# Patient Record
Sex: Female | Born: 2000 | Race: Black or African American | Hispanic: No | Marital: Single | Smoking: Light tobacco smoker
Health system: Southern US, Community
[De-identification: ages and names within clinical notes are randomized; demographics above are authoritative.]

## PROBLEM LIST (undated history)

## (undated) ENCOUNTER — Inpatient Hospital Stay (HOSPITAL_COMMUNITY): Payer: Self-pay

## (undated) DIAGNOSIS — Z789 Other specified health status: Secondary | ICD-10-CM

## (undated) DIAGNOSIS — F32A Depression, unspecified: Secondary | ICD-10-CM

## (undated) HISTORY — DX: Depression, unspecified: F32.A

## (undated) HISTORY — PX: NO PAST SURGERIES: SHX2092

---
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
## 2018-03-10 ENCOUNTER — Emergency Department (HOSPITAL_COMMUNITY)
Admission: EM | Admit: 2018-03-10 | Discharge: 2018-03-10 | Disposition: A | Discharge status: Home / Self Care | Payer: Medicaid Other | Attending: Emergency Medicine | Admitting: Emergency Medicine

## 2018-03-10 ENCOUNTER — Encounter (HOSPITAL_COMMUNITY): Payer: Self-pay

## 2018-03-10 ENCOUNTER — Other Ambulatory Visit: Payer: Self-pay

## 2018-03-10 DIAGNOSIS — N898 Other specified noninflammatory disorders of vagina: Secondary | ICD-10-CM | POA: Insufficient documentation

## 2018-03-10 DIAGNOSIS — K625 Hemorrhage of anus and rectum: Secondary | ICD-10-CM

## 2018-03-10 DIAGNOSIS — Z79899 Other long term (current) drug therapy: Secondary | ICD-10-CM | POA: Insufficient documentation

## 2018-03-10 LAB — I-STAT BETA HCG BLOOD, ED (MC, WL, AP ONLY): I-stat hCG, quantitative: <5 m[IU]/mL (ref <5)

## 2018-03-10 LAB — COMPREHENSIVE METABOLIC PANEL
ALK PHOS: 88 U/L (ref 38–126)
ALT: 12 U/L — AB (ref 14–54)
ANION GAP: 7 (ref 5–15)
AST: 16 U/L (ref 15–41)
Albumin: 4.1 g/dL (ref 3.5–5.0)
BUN: 7 mg/dL (ref 6–20)
CO2: 27 mmol/L (ref 22–32)
Calcium: 9.6 mg/dL (ref 8.9–10.3)
Chloride: 105 mmol/L (ref 101–111)
Creatinine, Ser: 0.78 mg/dL (ref 0.44–1.00)
Glucose, Bld: 96 mg/dL (ref 65–99)
Potassium: 3.7 mmol/L (ref 3.5–5.1)
SODIUM: 139 mmol/L (ref 135–145)
TOTAL PROTEIN: 7.5 g/dL (ref 6.5–8.1)
Total Bilirubin: 0.4 mg/dL (ref 0.3–1.2)

## 2018-03-10 LAB — CBC
HCT: 43.1 % (ref 36.0–46.0)
HEMOGLOBIN: 14.3 g/dL (ref 12.0–15.0)
MCH: 30.8 pg (ref 26.0–34.0)
MCHC: 33.2 g/dL (ref 30.0–36.0)
MCV: 92.7 fL (ref 78.0–100.0)
Platelets: 300 10*3/uL (ref 150–400)
RBC: 4.65 MIL/uL (ref 3.87–5.11)
RDW: 12.2 % (ref 11.5–15.5)
WBC: 5 10*3/uL (ref 4.0–10.5)

## 2018-03-10 LAB — WET PREP, GENITAL
Clue Cells Wet Prep HPF POC: NONE SEEN
Sperm: NONE SEEN
Trich, Wet Prep: NONE SEEN
Yeast Wet Prep HPF POC: NONE SEEN

## 2018-03-10 LAB — URINALYSIS, ROUTINE W REFLEX MICROSCOPIC
Bilirubin Urine: NEGATIVE
GLUCOSE, UA: NEGATIVE mg/dL
Hgb urine dipstick: NEGATIVE
KETONES UR: NEGATIVE mg/dL
LEUKOCYTES UA: NEGATIVE
NITRITE: NEGATIVE
PH: 7 (ref 5.0–8.0)
Protein, ur: NEGATIVE mg/dL
SPECIFIC GRAVITY, URINE: 1.025 (ref 1.005–1.030)

## 2018-03-10 MED ORDER — PSYLLIUM 0.52 G PO CAPS: 0.5200 g | ORAL_CAPSULE | Freq: Every day | ORAL | 0 refills | Status: DC

## 2018-03-10 MED ORDER — AZITHROMYCIN 250 MG PO TABS
1000.0000 mg | ORAL_TABLET | Freq: Once | ORAL | Status: AC
  Administered 2018-03-10: 1000 mg via ORAL
  Filled 2018-03-10: qty 4

## 2018-03-10 MED ORDER — LIDOCAINE HCL (PF) 1 % IJ SOLN
INTRAMUSCULAR | Status: AC
  Administered 2018-03-10: 5 mL
  Filled 2018-03-10: qty 5

## 2018-03-10 MED ORDER — CEFTRIAXONE SODIUM 250 MG IJ SOLR
250.0000 mg | Freq: Once | INTRAMUSCULAR | Status: AC
  Administered 2018-03-10: 250 mg via INTRAMUSCULAR
  Filled 2018-03-10: qty 250

## 2018-03-10 NOTE — ED Triage Notes (Signed)
Pt has multiple complaints. She states she has been having some bleeding from her rectum, light red in color. She states also had a cough with some blood tinged sputum. Pt also reports yellow vaginal discharge with itching.

## 2018-03-10 NOTE — ED Notes (Signed)
Pt states that she has noticed some blood after coughing for the past month and blood on toilet paper after using restroom. Pt is vague on description  of amount and other details.

## 2018-03-10 NOTE — ED Provider Notes (Signed)
[facility] EMERGENCY DEPARTMENT Provider Note   CSN: [id number] Arrival date & time: 03/10/18  1006     History   Chief Complaint Chief Complaint  Patient presents with  . Rectal Bleeding    HPI Stacy Sellers is a 17 y.o. female.  HPI   17 year old female presents today with several complaints.  Patient notes last night she had a small amount of blood in her saliva, notes this is one episode, denies any associated abnormal bruising or bleeding anywhere (other than rectal).  She notes no trauma to the mouth, no upper respiratory symptoms, and no bleeding since, history of the same.  Patient notes that 3 days ago after wiping she noted small amount of red blood on the toilet paper.  She notes this is similar to previous episodes that were associated with constipation.  She notes she was constipated yesterday.  She denies any bleeding today, denies any masses, denies any abdominal pain fever nausea or vomiting.  Patient reports that she is sexually active, she notes 3 days of yellow vaginal discharge, she reports she has sex with men.    Past Medical History:  Diagnosis Date  . Medical history non-contributory     Patient Active Problem List   Diagnosis Date Noted  . NVD (normal vaginal delivery) 05/26/2015  . Full-term premature rupture of membranes 05/25/2015    Past Surgical History:  Procedure Laterality Date  . NO PAST SURGERIES       OB History    Gravida  1   Para  1   Term      Preterm  1   AB      Living  1     SAB      TAB      Ectopic      Multiple  0   Live Births  1            Home Medications    Prior to Admission medications   Medication Sig Start Date End Date Taking? Authorizing Provider  clindamycin (CLEOCIN) 150 MG capsule Take 3 capsules (450 mg total) by mouth 3 (three) times daily. 07/03/17   Muthersbaugh, Dahlia Client, PA-C  HYDROcodone-acetaminophen (NORCO/VICODIN) 5-325 MG tablet Take 1-2 tablets by mouth  every 6 (six) hours as needed. 01/30/16   Hess, Nada Boozer, PA-C  ibuprofen (ADVIL,MOTRIN) 800 MG tablet Take 1 tablet (800 mg total) by mouth 3 (three) times daily. 07/03/17   Muthersbaugh, Dahlia Client, PA-C  phenylephrine-shark liver oil-mineral oil-petrolatum (PREPARATION H) 0.25-3-14-71.9 % rectal ointment Place 1 application rectally 2 (two) times daily as needed for hemorrhoids. 11/01/15   Hess, Nada Boozer, PA-C  psyllium (METAMUCIL) 0.52 g capsule Take 1 capsule (0.52 g total) by mouth daily. 03/10/18   Eyvonne Mechanic, PA-C    Family History Family History  Problem Relation Age of Onset  . Hypertension Maternal Grandmother     Social History Social History   Tobacco Use  . Smoking status: Never Smoker  . Smokeless tobacco: Never Used  Substance Use Topics  . Alcohol use: No  . Drug use: No     Allergies   Patient has no known allergies.   Review of Systems Review of Systems  All other systems reviewed and are negative.    Physical Exam Updated Vital Signs BP 111/69 (BP Location: Right Arm)   Pulse 73   Temp 98.4 F (36.9 C) (Oral)   Resp 16   LMP 03/01/2018   SpO2 100%  Physical Exam  Constitutional: She is oriented to person, place, and time. She appears well-developed and well-nourished.  HENT:  Head: Normocephalic and atraumatic.  No bleeding  Eyes: Pupils are equal, round, and reactive to light. Conjunctivae are normal. Right eye exhibits no discharge. Left eye exhibits no discharge. No scleral icterus.  Neck: Normal range of motion. No JVD present. No tracheal deviation present.  Pulmonary/Chest: Effort normal. No stridor.  Abdominal: Soft. She exhibits no distension and no mass. There is no tenderness. There is no rebound and no guarding. No hernia.  Genitourinary:  Genitourinary Comments: Purulent vaginal discharge noted vaginal vault, no cervical motion tenderness, no adnexal tenderness or masses  Neurological: She is alert and oriented to person, place, and  time. Coordination normal.  Skin:  No bruising  Psychiatric: She has a normal mood and affect. Her behavior is normal. Judgment and thought content normal.  Nursing note and vitals reviewed.   ED Treatments / Results  Labs (all labs ordered are listed, but only abnormal results are displayed) Labs Reviewed  WET PREP, GENITAL - Abnormal; Notable for the following components:      Result Value   WBC, Wet Prep HPF POC MANY (*)    All other components within normal limits  COMPREHENSIVE METABOLIC PANEL - Abnormal; Notable for the following components:   ALT 12 (*)    All other components within normal limits  CBC  URINALYSIS, ROUTINE W REFLEX MICROSCOPIC  I-STAT BETA HCG BLOOD, ED ([facility], [facility], [facility] ONLY)  I-STAT BETA HCG BLOOD, ED ([facility], [facility], [facility] ONLY)  POC OCCULT BLOOD, ED  GC/CHLAMYDIA PROBE AMP ([place]) NOT AT [facility]    EKG None  Radiology No results found.  Procedures Procedures (including critical care time)  Medications Ordered in ED Medications  azithromycin (ZITHROMAX) tablet 1,000 mg (1,000 mg Oral Given 03/10/18 1350)  cefTRIAXone (ROCEPHIN) injection 250 mg (250 mg Intramuscular Given 03/10/18 1351)  lidocaine (PF) (XYLOCAINE) 1 % injection (5 mLs  Given 03/10/18 1351)     Initial Impression / Assessment and Plan / ED Course  I have reviewed the triage vital signs and the nursing notes.  Pertinent labs & imaging results that were available during my care of the patient were reviewed by me and considered in my medical decision making (see chart for details).       Final Clinical Impressions(s) / ED Diagnoses   Final diagnoses:  Vaginal discharge  Rectal bleeding   Labs: GC, wet prep, i-STAT beta-hCG, urinalysis CBC  Imaging:  Consults:  Therapeutics: Ceftriaxone, azithromycin  Discharge Meds: Metamucil  Assessment/Plan: Patient presents with several complaints.  Patient having rectal bleeding, likely secondary to internal hemorrhoids, patient having no  bleeding now, she reports constipation.  She will be started on Metamucil, she has recurrence or continuation of symptoms she will follow-up as an outpatient.  Patient also having vaginal discharge most consistent with STD, she is treated prophylactically, no other acute findings on wet prep here.  Patient discharged with strict return precautions and follow-up information.  She verbalized understanding and agreement to today's plan.      ED Discharge Orders        Ordered    psyllium (METAMUCIL) 0.52 g capsule  Daily     03/10/18 1421       Eyvonne Mechanic, PA-C 03/10/18 1424    Wynetta Fines, MD 03/12/18 [phone]

## 2018-03-10 NOTE — Discharge Instructions (Addendum)
Please read attached information. If you experience any new or worsening signs or symptoms please return to the emergency room for evaluation. Please follow-up with your primary care provider or specialist as discussed. Please use medication prescribed only as directed and discontinue taking if you have any concerning signs or symptoms.   °

## 2018-03-11 LAB — GC/CHLAMYDIA PROBE AMP (~~LOC~~) NOT AT ARMC
Chlamydia: POSITIVE — AB
Neisseria Gonorrhea: POSITIVE — AB

## 2018-09-02 ENCOUNTER — Emergency Department (HOSPITAL_COMMUNITY): Payer: Medicaid Other

## 2018-09-02 ENCOUNTER — Emergency Department (HOSPITAL_COMMUNITY)
Admission: EM | Admit: 2018-09-02 | Discharge: 2018-09-02 | Disposition: A | Discharge status: Home / Self Care | Payer: Medicaid Other | Attending: Emergency Medicine | Admitting: Emergency Medicine

## 2018-09-02 DIAGNOSIS — Z79899 Other long term (current) drug therapy: Secondary | ICD-10-CM | POA: Insufficient documentation

## 2018-09-02 DIAGNOSIS — Z3A01 Less than 8 weeks gestation of pregnancy: Secondary | ICD-10-CM | POA: Insufficient documentation

## 2018-09-02 DIAGNOSIS — O2341 Unspecified infection of urinary tract in pregnancy, first trimester: Secondary | ICD-10-CM | POA: Diagnosis present

## 2018-09-02 DIAGNOSIS — N390 Urinary tract infection, site not specified: Secondary | ICD-10-CM

## 2018-09-02 LAB — URINALYSIS, ROUTINE W REFLEX MICROSCOPIC
Bilirubin Urine: NEGATIVE
Bilirubin Urine: NEGATIVE
GLUCOSE, UA: NEGATIVE mg/dL
Glucose, UA: NEGATIVE mg/dL
HGB URINE DIPSTICK: NEGATIVE
HGB URINE DIPSTICK: NEGATIVE
KETONES UR: 5 mg/dL — AB
Ketones, ur: 80 mg/dL — AB
NITRITE: NEGATIVE
NITRITE: NEGATIVE
PH: 6 (ref 5.0–8.0)
PROTEIN: 30 mg/dL — AB
PROTEIN: 30 mg/dL — AB
Specific Gravity, Urine: 1.03 (ref 1.005–1.030)
Specific Gravity, Urine: 1.03 (ref 1.005–1.030)
pH: 6 (ref 5.0–8.0)

## 2018-09-02 LAB — I-STAT BETA HCG BLOOD, ED (MC, WL, AP ONLY): I-stat hCG, quantitative: >2000 m[IU]/mL — ABNORMAL HIGH (ref <5)

## 2018-09-02 MED ORDER — PRENATAL COMPLETE 14-0.4 MG PO TABS: 1.0000 | ORAL_TABLET | Freq: Every day | ORAL | 0 refills | Status: DC

## 2018-09-02 MED ORDER — CEPHALEXIN 500 MG PO CAPS: 500.0000 mg | ORAL_CAPSULE | Freq: Four times a day (QID) | ORAL | 0 refills | Status: AC

## 2018-09-02 NOTE — Discharge Instructions (Signed)
We will contact you regarding your urine culture results if you need to be on a different antibiotic. Take the prenatal vitamin in the antibiotics as prescribed. Follow-up at the [facility] to establish OB/GYN care. Return to ED for worsening symptoms, vaginal bleeding, chest pain, shortness of breath or coughing up blood.

## 2018-09-02 NOTE — ED Triage Notes (Signed)
Pt presents to ED with possible pregnancy due to missed period in October and having a sick feeling. Pt denies any vomiting but feels nauseous. Pt denies any abd pain.

## 2018-09-02 NOTE — ED Notes (Signed)
Patient transported to Ultrasound 

## 2018-09-02 NOTE — ED Provider Notes (Signed)
[facility] EMERGENCY DEPARTMENT Provider Note   CSN: [id number] Arrival date & time: 09/02/18  1043     History   Chief Complaint Chief Complaint  Patient presents with  . Possible Pregnancy    HPI Stacy Sellers is a 17 y.o. female who presents to ED for pelvic/lower abdominal cramping, back pain, nausea, URI symptoms.  Patient states that her last menstrual cycle was on 07/10/2018.  She had one successful prior pregnancy and states that she did not have the symptoms with her prior pregnancy.  Pelvic pain and pressure gets worse with urination.  She reports increase in vaginal discharge as well.  She denies any vaginal bleeding, vomiting, changes to bowel movements, fever.  HPI  Past Medical History:  Diagnosis Date  . Medical history non-contributory     Patient Active Problem List   Diagnosis Date Noted  . NVD (normal vaginal delivery) 05/26/2015  . Full-term premature rupture of membranes 05/25/2015    Past Surgical History:  Procedure Laterality Date  . NO PAST SURGERIES       OB History    Gravida  1   Para  1   Term      Preterm  1   AB      Living  1     SAB      TAB      Ectopic      Multiple  0   Live Births  1            Home Medications    Prior to Admission medications   Medication Sig Start Date End Date Taking? Authorizing Provider  cephALEXin (KEFLEX) 500 MG capsule Take 1 capsule (500 mg total) by mouth 4 (four) times daily for 7 days. 09/02/18 09/09/18  Dwanna Goshert, PA-C  clindamycin (CLEOCIN) 150 MG capsule Take 3 capsules (450 mg total) by mouth 3 (three) times daily. 07/03/17   Muthersbaugh, Dahlia Client, PA-C  HYDROcodone-acetaminophen (NORCO/VICODIN) 5-325 MG tablet Take 1-2 tablets by mouth every 6 (six) hours as needed. 01/30/16   Hess, Nada Boozer, PA-C  ibuprofen (ADVIL,MOTRIN) 800 MG tablet Take 1 tablet (800 mg total) by mouth 3 (three) times daily. 07/03/17   Muthersbaugh, Dahlia Client, PA-C  phenylephrine-shark liver  oil-mineral oil-petrolatum (PREPARATION H) 0.25-3-14-71.9 % rectal ointment Place 1 application rectally 2 (two) times daily as needed for hemorrhoids. 11/01/15   Hess, Nada Boozer, PA-C  Prenatal Vit-Fe Fumarate-FA (PRENATAL COMPLETE) 14-0.4 MG TABS Take 1 tablet by mouth daily. 09/02/18   Kemauri Musa, PA-C  psyllium (METAMUCIL) 0.52 g capsule Take 1 capsule (0.52 g total) by mouth daily. 03/10/18   Eyvonne Mechanic, PA-C    Family History Family History  Problem Relation Age of Onset  . Hypertension Maternal Grandmother     Social History Social History   Tobacco Use  . Smoking status: Never Smoker  . Smokeless tobacco: Never Used  Substance Use Topics  . Alcohol use: No  . Drug use: No     Allergies   Patient has no known allergies.   Review of Systems Review of Systems  Constitutional: Negative for appetite change, chills and fever.  HENT: Negative for ear pain, rhinorrhea, sneezing and sore throat.   Eyes: Negative for photophobia and visual disturbance.  Respiratory: Negative for cough, chest tightness, shortness of breath and wheezing.   Cardiovascular: Negative for chest pain and palpitations.  Gastrointestinal: Positive for abdominal pain and nausea. Negative for blood in stool, constipation, diarrhea and vomiting.  Genitourinary: Positive for pelvic pain and vaginal discharge. Negative for dysuria, hematuria and urgency.  Musculoskeletal: Positive for back pain and myalgias.  Skin: Negative for rash.  Neurological: Negative for dizziness, weakness and light-headedness.     Physical Exam Updated Vital Signs BP 128/76 (BP Location: Right Arm)   Pulse 64   Temp 98.5 F (36.9 C) (Oral)   Resp 16   Ht 5\' 3"  (1.6 m)   Wt 68 kg   LMP 07/10/2018 (Approximate)   SpO2 98%   BMI 26.57 kg/m   Physical Exam  Constitutional: She appears well-developed and well-nourished. No distress.  HENT:  Head: Normocephalic and atraumatic.  Nose: Nose normal.  Eyes: Conjunctivae  and EOM are normal. Right eye exhibits no discharge. Left eye exhibits no discharge. No scleral icterus.  Neck: Normal range of motion. Neck supple.  Cardiovascular: Normal rate, regular rhythm, normal heart sounds and intact distal pulses. Exam reveals no gallop and no friction rub.  No murmur heard. Pulmonary/Chest: Effort normal and breath sounds normal. No respiratory distress.  Abdominal: Soft. Bowel sounds are normal. She exhibits no distension. There is tenderness (Suprapubic, left lower quadrant, right lower quadrant). There is no guarding.  Musculoskeletal: Normal range of motion. She exhibits no edema.  Neurological: She is alert. She exhibits normal muscle tone. Coordination normal.  Skin: Skin is warm and dry. No rash noted.  Psychiatric: She has a normal mood and affect.  Nursing note and vitals reviewed.    ED Treatments / Results  Labs (all labs ordered are listed, but only abnormal results are displayed) Labs Reviewed  URINALYSIS, ROUTINE W REFLEX MICROSCOPIC - Abnormal; Notable for the following components:      Result Value   APPearance HAZY (*)    Ketones, ur 5 (*)    Protein, ur 30 (*)    Leukocytes, UA MODERATE (*)    Bacteria, UA FEW (*)    All other components within normal limits  URINALYSIS, ROUTINE W REFLEX MICROSCOPIC - Abnormal; Notable for the following components:   APPearance HAZY (*)    Ketones, ur 80 (*)    Protein, ur 30 (*)    Leukocytes, UA TRACE (*)    Bacteria, UA FEW (*)    All other components within normal limits  I-STAT BETA HCG BLOOD, ED ([facility], [facility], [facility] ONLY) - Abnormal; Notable for the following components:   I-stat hCG, quantitative >2,000.0 (*)    All other components within normal limits  URINE CULTURE    EKG None  Radiology US Ob Comp < 14 Wks  Result Date: 09/02/2018 CLINICAL DATA:  Abdominal pain, beta HCG greater than 2000. The patient refused transvaginal ultrasound. EXAM: OBSTETRIC <14 WK ULTRASOUND TECHNIQUE:  Transabdominal ultrasound was performed for evaluation of the gestation as well as the maternal uterus and adnexal regions. COMPARISON:  None. FINDINGS: Intrauterine gestational sac: Single. There is a small subchorionic hemorrhage Yolk sac:  Present Embryo:  Present Cardiac Activity: Present Heart Rate: 136 bpm CRL:   7 mm   6 w 4 d                  [place] EDC: April 24, 2019 Subchorionic hemorrhage:  There is a small subchorionic hemorrhage Maternal uterus/adnexae: The ovaries are normal in appearance. IMPRESSION: Single viable IUP with estimated gestational age of [redacted] weeks 4 days and estimated date of delivery April 24, 2019. There is a small subchorionic hemorrhage. Electronically Signed   By: David  [place] M.D.  On: 09/02/2018 13:09    Procedures Procedures (including critical care time)  Medications Ordered in ED Medications - No data to display   Initial Impression / Assessment and Plan / ED Course  I have reviewed the triage vital signs and the nursing notes.  Pertinent labs & imaging results that were available during my care of the patient were reviewed by me and considered in my medical decision making (see chart for details).  Clinical Course as of Sep 03 1431  Tue Sep 02, 2018  1217 I-stat hCG, quantitative(!): >2,000.0 [HK]  1217 Patient reports lower pelvic pain and back pain.  Will need to obtain ultrasound to rule out ectopic pregnancy as patient's hCG is elevated to greater than 2000.  Patient also reports vaginal discharge but declines pelvic exam at this time.   [HK]  1325 Will ask for patient to recollect urine.   [HK]  1325 Squamous Epithelial / LPF: 21-50 [HK]    Clinical Course User Index [HK] Dietrich Pates, PA-C    17 year old female presents to ED for pelvic cramping/lower abdominal pain, back pain, nausea.  Last menstrual cycle was on 07/10/2018.  She does report vaginal discharge and pelvic pain worse with urination.  She had one successful prior pregnancy.  On exam  there is some abdominal tenderness to palpation without rebound or guarding noted.  Lab work significant for hCG of greater than 2000.  Ultrasound shows single IUP at 6 weeks and 4 days gestation, with small subchorionic hemorrhage.  Urinalysis contaminated with 21-50 squamous epithelial cells on first collection.  Repeat urinalysis still with few bacteria and leukocytes.  Will treat for possible UTI.  Will treat with Keflex and sent for culture.  Will otherwise advise her to establish care with OB/GYN but to return to ED for any severe worsening symptoms.  Patient is hemodynamically stable, in NAD, and able to ambulate in the ED. Evaluation does not show pathology that would require ongoing emergent intervention or inpatient treatment. I explained the diagnosis to the patient. Pain has been managed and has no complaints prior to discharge. Patient is comfortable with above plan and is stable for discharge at this time. All questions were answered prior to disposition. Strict return precautions for returning to the ED were discussed. Encouraged follow up with PCP.    Portions of this note were generated with Scientist, clinical (histocompatibility and immunogenetics). Dictation errors may occur despite best attempts at proofreading.   Final Clinical Impressions(s) / ED Diagnoses   Final diagnoses:  Less than [redacted] weeks gestation of pregnancy  Lower urinary tract infectious disease    ED Discharge Orders         Ordered    Prenatal Vit-Fe Fumarate-FA (PRENATAL COMPLETE) 14-0.4 MG TABS  Daily     09/02/18 1431    cephALEXin (KEFLEX) 500 MG capsule  4 times daily     09/02/18 [address], PA-C 09/02/18 1432    Vanetta Mulders, MD 09/03/18 [phone]

## 2018-09-03 LAB — URINE CULTURE: Culture: <10000 — AB

## 2018-10-29 NOTE — L&D Delivery Note (Addendum)
Stacy Sellers is a 18 y.o. female G100P0101 with IUP at [redacted]w[redacted]d admitted for spontaneous onset of labor.  She progressed with AROM augmentation to complete and pushed 15 minutes to deliver.  Cord clamping delayed by several minutes then clamped by CNM and cut by mother of patient.    Delivery Note At 11:06 AM a viable female was delivered via Vaginal, Spontaneous (Presentation: ROA).  APGAR: 8, 9 ; weight pending .   Placenta status: spontaneous, intact with marginal cord insertion.  Cord: 3 vessels.   Anesthesia:  IV fentanyl Episiotomy: None Lacerations:  none Suture Repair: n/a Est. Blood Loss (mL):  106  Mom to postpartum.  Baby to Couplet care / Skin to Skin.  Wende Mott CNM 04/25/2019, 11:25 AM  PP message sent

## 2018-12-30 ENCOUNTER — Inpatient Hospital Stay (HOSPITAL_COMMUNITY)
Admission: AD | Admit: 2018-12-30 | Discharge: 2018-12-30 | Disposition: A | Discharge status: Home / Self Care | Payer: Medicaid Other | Attending: Obstetrics and Gynecology | Admitting: Obstetrics and Gynecology

## 2018-12-30 ENCOUNTER — Encounter (HOSPITAL_COMMUNITY): Payer: Self-pay

## 2018-12-30 ENCOUNTER — Other Ambulatory Visit: Payer: Self-pay

## 2018-12-30 DIAGNOSIS — Z3A23 23 weeks gestation of pregnancy: Secondary | ICD-10-CM | POA: Insufficient documentation

## 2018-12-30 DIAGNOSIS — O0932 Supervision of pregnancy with insufficient antenatal care, second trimester: Secondary | ICD-10-CM

## 2018-12-30 DIAGNOSIS — N760 Acute vaginitis: Secondary | ICD-10-CM

## 2018-12-30 DIAGNOSIS — O23592 Infection of other part of genital tract in pregnancy, second trimester: Secondary | ICD-10-CM | POA: Insufficient documentation

## 2018-12-30 DIAGNOSIS — R109 Unspecified abdominal pain: Secondary | ICD-10-CM | POA: Diagnosis present

## 2018-12-30 DIAGNOSIS — O23593 Infection of other part of genital tract in pregnancy, third trimester: Secondary | ICD-10-CM

## 2018-12-30 DIAGNOSIS — B9689 Other specified bacterial agents as the cause of diseases classified elsewhere: Secondary | ICD-10-CM | POA: Diagnosis not present

## 2018-12-30 DIAGNOSIS — Z8751 Personal history of pre-term labor: Secondary | ICD-10-CM

## 2018-12-30 LAB — URINALYSIS, ROUTINE W REFLEX MICROSCOPIC
Bilirubin Urine: NEGATIVE
Glucose, UA: NEGATIVE mg/dL
HGB URINE DIPSTICK: NEGATIVE
Ketones, ur: 5 mg/dL — AB
LEUKOCYTE UA: NEGATIVE
NITRITE: NEGATIVE
Protein, ur: NEGATIVE mg/dL
Specific Gravity, Urine: 1.014 (ref 1.005–1.030)
pH: 6 (ref 5.0–8.0)

## 2018-12-30 LAB — WET PREP, GENITAL
Sperm: NONE SEEN
TRICH WET PREP: NONE SEEN
YEAST WET PREP: NONE SEEN

## 2018-12-30 MED ORDER — ACETAMINOPHEN 500 MG PO TABS
1000.0000 mg | ORAL_TABLET | Freq: Once | ORAL | Status: AC
  Administered 2018-12-30: 1000 mg via ORAL
  Filled 2018-12-30: qty 2

## 2018-12-30 MED ORDER — METRONIDAZOLE 500 MG PO TABS: 500.0000 mg | ORAL_TABLET | Freq: Two times a day (BID) | ORAL | 0 refills | Status: DC

## 2018-12-30 NOTE — Discharge Instructions (Signed)

## 2018-12-30 NOTE — MAU Note (Signed)
Stacy Sellers is a 18 y.o. here in MAU reporting: lower abdominal pain for the past month. States she was seeing if it would go away. Pt reports some increased discharge but no bleeding. No LOF. Pt states she has not started [facility]  Onset of complaint: ongoing  Pain score: 9/10  Vitals:   12/30/18 1345  BP: (!) 105/52  Pulse: 80  Resp: 18  Temp: 98.8 F (37.1 C)  SpO2: 100%      FHT:150  Lab orders placed from triage: UA

## 2018-12-30 NOTE — MAU Provider Note (Signed)
History     CSN: [id number]  Arrival date and time: 12/30/18 1321   First Provider Initiated Contact with Patient 12/30/18 1446      Chief Complaint  Patient presents with  . Abdominal Pain  . Vaginal Discharge   HPI Stacy Sellers is a 18 y.o. G2P0101 at [redacted]w[redacted]d who presents to MAU with chief complaints of abdominal pain, abnormal vaginal discharge and low back pain.  She has not initiated prenatal care yet this pregnancy.  She denies vaginal bleeding, leaking of fluid, decreased fetal movement, fever, falls, or recent illness.    Abdominal pain and vaginal discharge. These are recurring problems, onset two months ago. Symptoms wax and wane. Discharge is not itchy or foul-smelling. Abdominal pain is mild, "crampy", and resolves without intervention.  Low back pain This is a chronic problem, onset two years ago. Patient works in [employer] and routinely experiences moderate to severe back pain. She manages this pain with Tylenol. She denies aggravating or alleviating factors.  Most recent sexual intercourse reported as October 2019.  OB History    Gravida  2   Para  1   Term      Preterm  1   AB      Living  1     SAB      TAB      Ectopic      Multiple  0   Live Births  1           Past Medical History:  Diagnosis Date  . Medical history non-contributory     Past Surgical History:  Procedure Laterality Date  . NO PAST SURGERIES      Family History  Problem Relation Age of Onset  . Hypertension Maternal Grandmother     Social History   Tobacco Use  . Smoking status: Never Smoker  . Smokeless tobacco: Never Used  Substance Use Topics  . Alcohol use: No  . Drug use: No    Allergies: No Known Allergies  Medications Prior to Admission  Medication Sig Dispense Refill Last Dose  . Prenatal Vit-Fe Fumarate-FA (PRENATAL COMPLETE) 14-0.4 MG TABS Take 1 tablet by mouth daily. 60 each 0 12/29/2018 at Unknown time  . clindamycin (CLEOCIN)  150 MG capsule Take 3 capsules (450 mg total) by mouth 3 (three) times daily. 90 capsule 0   . HYDROcodone-acetaminophen (NORCO/VICODIN) 5-325 MG tablet Take 1-2 tablets by mouth every 6 (six) hours as needed. 4 tablet 0   . ibuprofen (ADVIL,MOTRIN) 800 MG tablet Take 1 tablet (800 mg total) by mouth 3 (three) times daily. 21 tablet 0   . phenylephrine-shark liver oil-mineral oil-petrolatum (PREPARATION H) 0.25-3-14-71.9 % rectal ointment Place 1 application rectally 2 (two) times daily as needed for hemorrhoids. 30 g 0   . psyllium (METAMUCIL) 0.52 g capsule Take 1 capsule (0.52 g total) by mouth daily. 30 capsule 0     Review of Systems  Constitutional: Negative for chills, fatigue and fever.  Gastrointestinal: Negative for abdominal pain, nausea and vomiting.  Genitourinary: Positive for vaginal discharge. Negative for difficulty urinating, dysuria, flank pain, vaginal bleeding and vaginal pain.  Musculoskeletal: Positive for back pain.  Neurological: Negative for dizziness, weakness and headaches.  All other systems reviewed and are negative.  Physical Exam   Blood pressure (!) 105/52, pulse 80, temperature 98.8 F (37.1 C), temperature source Oral, resp. rate 18, height 5\' 3"  (1.6 m), weight 75.3 kg, last menstrual period 07/10/2018, SpO2 100 %, unknown  if currently breastfeeding.  Physical Exam  Nursing note and vitals reviewed. Constitutional: She is oriented to person, place, and time. She appears well-developed and well-nourished.  Cardiovascular: Normal rate.  Respiratory: Effort normal.  GI: She exhibits no distension. There is no abdominal tenderness. There is no rebound and no guarding.  gravid  Musculoskeletal: Normal range of motion.  Neurological: She is alert and oriented to person, place, and time.  Skin: Skin is warm and dry.  Psychiatric: She has a normal mood and affect. Her behavior is normal. Judgment and thought content normal.    MAU Course/MDM   Procedures  --Fetal Fibronectin not collected due to recent intercourse --History of preterm delivery. Discussed importance of establishing prenatal care to reduce/manage risk factors.  Patient Vitals for the past 24 hrs:  BP Temp Temp src Pulse Resp SpO2 Height Weight  12/30/18 1345 (!) 105/52 98.8 F (37.1 C) Oral 80 18 100 % - -  12/30/18 1339 - - - - - - 5\' 3"  (1.6 m) 75.3 kg    Results for orders placed or performed during the hospital encounter of 12/30/18 (from the past 24 hour(s))  Wet prep, genital     Status: Abnormal   Collection Time: 12/30/18  2:40 PM  Result Value Ref Range   Yeast Wet Prep HPF POC NONE SEEN NONE SEEN   Trich, Wet Prep NONE SEEN NONE SEEN   Clue Cells Wet Prep HPF POC PRESENT (A) NONE SEEN   WBC, Wet Prep HPF POC MANY (A) NONE SEEN   Sperm NONE SEEN   Urinalysis, Routine w reflex microscopic     Status: Abnormal   Collection Time: 12/30/18  3:00 PM  Result Value Ref Range   Color, Urine YELLOW YELLOW   APPearance CLEAR CLEAR   Specific Gravity, Urine 1.014 1.005 - 1.030   pH 6.0 5.0 - 8.0   Glucose, UA NEGATIVE NEGATIVE mg/dL   Hgb urine dipstick NEGATIVE NEGATIVE   Bilirubin Urine NEGATIVE NEGATIVE   Ketones, ur 5 (A) NEGATIVE mg/dL   Protein, ur NEGATIVE NEGATIVE mg/dL   Nitrite NEGATIVE NEGATIVE   Leukocytes,Ua NEGATIVE NEGATIVE    Meds ordered this encounter  Medications  . acetaminophen (TYLENOL) tablet 1,000 mg  . metroNIDAZOLE (FLAGYL) 500 MG tablet    Sig: Take 1 tablet (500 mg total) by mouth 2 (two) times daily.    Dispense:  14 tablet    Refill:  0    Order Specific Question:   Supervising Provider    Answer:   Reva Bores [[id number]]   Assessment and Plan  --18 y.o. G2P0101 at [redacted]w[redacted]d  --FHT 150 --Bacterial Vaginosis, rx to pharmacy --History of preterm delivery --Patient denies pain at time of discharge --Discharge home in stable condition  F/U: Patient has New OB at [facility] 01/03/2019  Calvert Cantor, CNM 12/30/2018,  4:13 PM

## 2019-01-01 LAB — GC/CHLAMYDIA PROBE AMP (~~LOC~~) NOT AT ARMC
Chlamydia: POSITIVE — AB
Neisseria Gonorrhea: NEGATIVE

## 2019-01-02 ENCOUNTER — Other Ambulatory Visit: Payer: Self-pay | Admitting: Advanced Practice Midwife

## 2019-01-02 MED ORDER — AZITHROMYCIN 500 MG PO TABS: 1000.0000 mg | ORAL_TABLET | Freq: Once | ORAL | 0 refills | Status: AC

## 2019-01-02 NOTE — Progress Notes (Signed)
Patient positive for Chlamydia in MAU. Rx sent to patient pharmacy and problem list updated. Clinic nurse has attempted to notify via telephone. Patient does not have active MyChart account. Clayton Bibles, CNM 01/02/19 6:13 AM

## 2019-01-06 ENCOUNTER — Encounter: Payer: Self-pay | Admitting: Advanced Practice Midwife

## 2019-01-06 DIAGNOSIS — O98819 Other maternal infectious and parasitic diseases complicating pregnancy, unspecified trimester: Secondary | ICD-10-CM

## 2019-01-06 DIAGNOSIS — A749 Chlamydial infection, unspecified: Secondary | ICD-10-CM | POA: Insufficient documentation

## 2019-01-13 ENCOUNTER — Encounter: Payer: Medicaid Other | Admitting: Advanced Practice Midwife

## 2019-01-23 ENCOUNTER — Ambulatory Visit (INDEPENDENT_AMBULATORY_CARE_PROVIDER_SITE_OTHER): Payer: Medicaid Other | Admitting: Medical

## 2019-01-23 ENCOUNTER — Other Ambulatory Visit: Payer: Self-pay

## 2019-01-23 ENCOUNTER — Encounter: Payer: Self-pay | Admitting: Medical

## 2019-01-23 VITALS — BP 108/72 | HR 83 | Wt 169.6 lb

## 2019-01-23 DIAGNOSIS — F32A Depression, unspecified: Secondary | ICD-10-CM

## 2019-01-23 DIAGNOSIS — Z3490 Encounter for supervision of normal pregnancy, unspecified, unspecified trimester: Secondary | ICD-10-CM | POA: Insufficient documentation

## 2019-01-23 DIAGNOSIS — F329 Major depressive disorder, single episode, unspecified: Secondary | ICD-10-CM

## 2019-01-23 DIAGNOSIS — Z8751 Personal history of pre-term labor: Secondary | ICD-10-CM

## 2019-01-23 DIAGNOSIS — O99342 Other mental disorders complicating pregnancy, second trimester: Secondary | ICD-10-CM

## 2019-01-23 DIAGNOSIS — Z3400 Encounter for supervision of normal first pregnancy, unspecified trimester: Secondary | ICD-10-CM

## 2019-01-23 DIAGNOSIS — Z3481 Encounter for supervision of other normal pregnancy, first trimester: Secondary | ICD-10-CM

## 2019-01-23 DIAGNOSIS — O98812 Other maternal infectious and parasitic diseases complicating pregnancy, second trimester: Secondary | ICD-10-CM

## 2019-01-23 DIAGNOSIS — Z3A27 27 weeks gestation of pregnancy: Secondary | ICD-10-CM

## 2019-01-23 DIAGNOSIS — A749 Chlamydial infection, unspecified: Secondary | ICD-10-CM

## 2019-01-23 DIAGNOSIS — O0932 Supervision of pregnancy with insufficient antenatal care, second trimester: Secondary | ICD-10-CM

## 2019-01-23 MED ORDER — AZITHROMYCIN 250 MG PO TABS: 1000.0000 mg | ORAL_TABLET | Freq: Once | ORAL | 0 refills | Status: AC

## 2019-01-23 NOTE — Progress Notes (Signed)
   PRENATAL VISIT NOTE  Subjective:  Stacy Sellers is a 18 y.o. G2P0101 at [redacted]w[redacted]d being seen today for her first prenatal care visit.  She is currently monitored for the following issues for this high-risk pregnancy and has History of premature delivery; Chlamydia infection affecting pregnancy; Encounter for supervision of normal pregnancy, antepartum; and Late prenatal care complicating pregnancy in second trimester on their problem list.  Patient reports no complaints.  Contractions: Not present. Vag. Bleeding: None.  Movement: Present. Denies leaking of fluid.   The following portions of the patient's history were reviewed and updated as appropriate: allergies, current medications, past family history, past medical history, past social history, past surgical history and problem list.   Objective:   Vitals:   01/23/19 1106  BP: 108/72  Pulse: 83  Weight: 169 lb 9.6 oz (76.9 kg)    Fetal Status: Fetal Heart Rate (bpm): 146 Fundal Height: 27 cm Movement: Present     General:  Alert, oriented and cooperative. Patient is in no acute distress.  Skin: Skin is warm and dry. No rash noted.   Cardiovascular: Normal heart rate noted  Respiratory: Normal respiratory effort, no problems with respiration noted  Abdomen: Soft, gravid, appropriate for gestational age.  Pain/Pressure: Absent     Pelvic: Cervical exam deferred        Extremities: Normal range of motion.  Edema: None  Mental Status: Normal mood and affect. Normal behavior. Normal judgment and thought content.   Assessment and Plan:  Pregnancy: G2P0101 at [redacted]w[redacted]d 1. Supervision of normal first pregnancy, antepartum - Culture, OB Urine - Obstetric Panel, Including HIV - [place] MFM OB DETAIL +14 WK; Future - Currently only Family Planning Medicaid - will wait for genetic testing and carriers screening  2. Late prenatal care complicating pregnancy in second trimester - NOB at 27 weeks  3. History of premature delivery - SVD at [redacted]w[redacted]d -  Too late to start 17-P  4. Chlamydia infection affecting pregnancy in second trimester - patient states she was not aware of + test, RN notes show unable to reach patient with results  - azithromycin (ZITHROMAX) 250 MG tablet; Take 4 tablets (1,000 mg total) by mouth once for 1 dose.  Dispense: 4 tablet; Refill: 0 - partner treatment advised, abstinence x 2 weeks   5. Depression - Patient told CMA prior to leaving that she also has issues with depression - PHQ 9 and GAD7 performed - positive screen - Ambulatory Referral to IBH at [facility] ordered and inbasket message sent to scheduling    Preterm labor symptoms and general obstetric precautions including but not limited to vaginal bleeding, contractions, leaking of fluid and fetal movement were reviewed in detail with the patient. Please refer to After Visit Summary for other counseling recommendations.   Return in about 2 weeks (around 02/06/2019) for LOB, 28 week labs (fasting).  Future Appointments  Date Time Provider Department Center  02/05/2019  1:45 PM [facility] [place] 2 [facility]    Vonzella Nipple, PA-C

## 2019-01-23 NOTE — Progress Notes (Signed)
Patient reports she lost Rxs for BV and Chlamydia  - never treated. Patient reports father has been treated.

## 2019-01-23 NOTE — Patient Instructions (Signed)
Safe Medications in Pregnancy   Acne: Benzoyl Peroxide Salicylic Acid  Backache/Headache: Tylenol: 2 regular strength every 4 hours OR              2 Extra strength every 6 hours  Colds/Coughs/Allergies: Benadryl (alcohol free) 25 mg every 6 hours as needed Breath right strips Claritin Cepacol throat lozenges Chloraseptic throat spray Cold-Eeze- up to three times per day Cough drops, alcohol free Flonase (by prescription only) Guaifenesin Mucinex Robitussin DM (plain only, alcohol free) Saline nasal spray/drops Sudafed (pseudoephedrine) & Actifed ** use only after [redacted] weeks gestation and if you do not have high blood pressure Tylenol Vicks Vaporub Zinc lozenges Zyrtec   Constipation: Colace Ducolax suppositories Fleet enema Glycerin suppositories Metamucil Milk of magnesia Miralax Senokot Smooth move tea  Diarrhea: Kaopectate Imodium A-D  *NO pepto Bismol  Hemorrhoids: Anusol Anusol HC Preparation H Tucks  Indigestion: Tums Maalox Mylanta Zantac  Pepcid  Insomnia: Benadryl (alcohol free) 25mg every 6 hours as needed Tylenol PM Unisom, no Gelcaps  Leg Cramps: Tums MagGel  Nausea/Vomiting:  Bonine Dramamine Emetrol Ginger extract Sea bands Meclizine  Nausea medication to take during pregnancy:  Unisom (doxylamine succinate 25 mg tablets) Take one tablet daily at bedtime. If symptoms are not adequately controlled, the dose can be increased to a maximum recommended dose of two tablets daily (1/2 tablet in the morning, 1/2 tablet mid-afternoon and one at bedtime). Vitamin B6 100mg tablets. Take one tablet twice a day (up to 200 mg per day).  Skin Rashes: Aveeno products Benadryl cream or 25mg every 6 hours as needed Calamine Lotion 1% cortisone cream  Yeast infection: Gyne-lotrimin 7 Monistat 7   **If taking multiple medications, please check labels to avoid duplicating the same active ingredients **take medication as directed on  the label ** Do not exceed 4000 mg of tylenol in 24 hours **Do not take medications that contain aspirin or ibuprofen   Childbirth Education Options: [place] [facility] Classes:  Childbirth education classes can help you get ready for a positive parenting experience. You can also meet other expectant parents and get free stuff for your baby. Each class runs for five weeks on the same night and costs $45 for the mother-to-be and her support person. Medicaid covers the cost if you are eligible. Call [phone] to register. [facility] Childbirth Education:  [phone] or [phone] or [email or web address]  Baby & Me Class: Discuss newborn & infant parenting and family adjustment issues with other new mothers in a relaxed environment. Each week brings a new speaker or baby-centered activity. We encourage new mothers to join us every Thursday at 11:00am. Babies birth until crawling. No registration or fee. Daddy Boot Camp: This course offers Dads-to-be the tools and knowledge needed to feel confident on their journey to becoming new fathers. Experienced dads, who have been trained as coaches, teach dads-to-be how to hold, comfort, diaper, swaddle and play with their infant while being able to support the new mom as well. A class for men taught by men. $25/dad Big Brother/Big Sister: Let your children share in the joy of a new brother or sister in this special class designed just for them. Class includes discussion about how families care for babies: swaddling, holding, diapering, safety as well as how they can be helpful in their new role. This class is designed for children ages 2 to 6, but any age is welcome. Please register each child individually. $5/child  Mom Talk: This mom-led group offers support and connection to   mothers as they journey through the adjustments and struggles of that sometimes overwhelming first year after the birth of a child. Tuesdays at 10:00am and  Thursdays at 6:00pm. Babies welcome. No registration or fee. Breastfeeding Support Group: This group is a mother-to-mother support circle where moms have the opportunity to share their breastfeeding experiences. A Lactation Consultant is present for questions and concerns. Meets each Tuesday at 11:00am. No fee or registration. Breastfeeding Your Baby: Learn what to expect in the first days of breastfeeding your newborn.  This class will help you feel more confident with the skills needed to begin your breastfeeding experience. Many new mothers are concerned about breastfeeding after leaving the hospital. This class will also address the most common fears and challenges about breastfeeding during the first few weeks, months and beyond. (call for fee) Comfort Techniques and Tour: This 2 hour interactive class will provide you the opportunity to learn & practice hands-on techniques that can help relieve some of the discomfort of labor and encourage your baby to rotate toward the best position for birth. You and your partner will be able to try a variety of labor positions with birth balls and rebozos as well as practice breathing, relaxation, and visualization techniques. A tour of the [facility] is included with this class. $20 per registrant and support person Childbirth Class- Weekend Option: This class is a Weekend version of our Birth & Baby series. It is designed for parents who have a difficult time fitting several weeks of classes into their schedule. It covers the care of your newborn and the basics of labor and childbirth. It also includes a Maternity Care Center Tour of [facility] and lunch. The class is held two consecutive days: beginning on Friday evening from 6:30 - 8:30 p.m. and the next day, Saturday from 9 a.m. - 4 p.m. (call for fee) Waterbirth Class: Interested in a waterbirth?  This informational class will help you discover whether waterbirth is the right fit  for you. Education about waterbirth itself, supplies you would need and how to assemble your support team is what you can expect from this class. Some obstetrical practices require this class in order to pursue a waterbirth. (Not all obstetrical practices offer waterbirth-check with your healthcare provider.) Register only the expectant mom, but you are encouraged to bring your partner to class! Required if planning waterbirth, no fee. Infant/Child CPR: Parents, grandparents, babysitters, and friends learn Cardio-Pulmonary Resuscitation skills for infants and children. You will also learn how to treat both conscious and unconscious choking in infants and children. This Family & Friends program does not offer certification. Register each participant individually to ensure that enough mannequins are available. (Call for fee) Grandparent Love: Expecting a grandbaby? This class is for you! Learn about the latest infant care and safety recommendations and ways to support your own child as he or she transitions into the parenting role. Taught by Registered Nurses who are childbirth instructors, but most importantly...they are grandmothers too! $10/person. Childbirth Class- Natural Childbirth: This series of 5 weekly classes is for expectant parents who want to learn and practice natural methods of coping with the process of labor and childbirth. Relaxation, breathing, massage, visualization, role of the partner, and helpful positioning are highlighted. Participants learn how to be confident in their body's ability to give birth. This class will empower and help parents make informed decisions about their own care. Includes discussion that will help new parents transition into the immediate postpartum period. Maternity   Care Center Tour of [facility] is included. We suggest taking this class between 25-32 weeks, but it's only a recommendation. $75 per registrant and one support person or $30 Medicaid. Childbirth  Class- 3 week Series: This option of 3 weekly classes helps you and your labor partner prepare for childbirth. Newborn care, labor & birth, cesarean birth, pain management, and comfort techniques are discussed and a Maternity Care Center Tour of [facility] is included. The class meets at the same time, on the same day of the week for 3 consecutive weeks beginning with the starting date you choose. $60 for registrant and one support person.  Marvelous Multiples: Expecting twins, triplets, or more? This class covers the differences in labor, birth, parenting, and breastfeeding issues that face multiples' parents. NICU tour is included. Led by a Certified Childbirth Educator who is the mother of twins. No fee. Caring for Baby: This class is for expectant and adoptive parents who want to learn and practice the most up-to-date newborn care for their babies. Focus is on birth through the first six weeks of life. Topics include feeding, bathing, diapering, crying, umbilical cord care, circumcision care and safe sleep. Parents learn to recognize symptoms of illness and when to call the pediatrician. Register only the mom-to-be and your partner or support person can plan to come with you! $10 per registrant and support person Childbirth Class- online option: This online class offers you the freedom to complete a Birth and Baby series in the comfort of your own home. The flexibility of this option allows you to review sections at your own pace, at times convenient to you and your support people. It includes additional video information, animations, quizzes, and extended activities. Get organized with helpful eClass tools, checklists, and trackers. Once you register online for the class, you will receive an email within a few days to accept the invitation and begin the class when the time is right for you. The content will be available to you for 60 days. $60 for 60 days of online access for you and your support  people.  Local Doulas: Natural Baby Doulas [email or web address] Tel: [phone] [email or web address] [place] [facility] [phone] [email or web address] The Labor Ladies  (also do waterbirth tub rental) [phone] [email or web address] [facility] [phone] [email or web address] Sacred Rhythms  [phone] [email or web address] [place] [employer] (PADA) [email or web address] [email or web address].org/index.htm [place] [facility]  [email or web address] Considering Waterbirth? Guide for patients at [facility]  Why consider waterbirth?  . Gentle birth for babies . Less pain medicine used in labor . May allow for passive descent/less pushing . May reduce perineal tears  . More mobility and instinctive maternal position changes . Increased maternal relaxation . Reduced blood pressure in labor  Is waterbirth safe? What are the risks of infection, drowning or other complications?  . Infection: o Very low risk (3.7 % for tub vs 4.8% for bed) o 7 in 8000 waterbirths with documented infection o Poorly cleaned equipment most common cause o Slightly lower group B strep transmission rate  . Drowning o Maternal:  - Very low risk   - Related to seizures or fainting o Newborn:  - Very low risk. No evidence of increased risk of respiratory problems in multiple large studies - Physiological protection from breathing under water - Avoid underwater birth if there are any fetal complications - Once baby's head is out of the water, keep it out.  . Birth complication o Some reports of cord trauma, but risk decreased by   bringing baby to surface gradually o No evidence of increased risk of shoulder dystocia. Mothers can usually change positions faster in water than in a bed, possibly aiding the  maneuvers to free the shoulder.   You must attend a Waterbirth class at [facility]  3rd Wednesday of every month from 7-9pm  Free  Register by calling [phone] or online at [email or web address]  Bring us the certificate from the class to your prenatal appointment  Meet with a midwife at 36 weeks to see if you can still plan a waterbirth and to sign the consent.   Purchase or rent the following supplies:   Water Birth Pool (Birth Pool in a Box or [place] for instance)  (Tubs start ~$125)  Single-use disposable tub liner designed for your brand of tub  New garden hose labeled "lead-free", "suitable for drinking water",  Electric drain pump to remove water (We recommend 792 gallon per hour or greater pump.)   Separate garden hose to remove the dirty water  Fish net  Bathing suit top (optional)  Long-handled mirror (optional)  Places to purchase or rent supplies  Yourwaterbirth.com for tub purchases and supplies  Waterbirthsolutions.com for tub purchases and supplies  The Labor Ladies ([email or web address]) $275 for tub rental/set-up & take down/kit   [place] [employer] (http://www.padanc.org/MeetUs.htm) Information regarding doulas (labor support) who provide pool rentals  Our practice has a Birth Pool in a Box tub at the hospital that you may borrow on a first-come-first-served basis. It is your responsibility to to set up, clean and break down the tub. We cannot guarantee the availability of this tub in advance. You are responsible for bringing all accessories listed above. If you do not have all necessary supplies you cannot have a waterbirth.    Things that would prevent you from having a waterbirth:  Premature, <37wks  Previous cesarean birth  Presence of thick meconium-stained fluid  Multiple gestation (Twins, triplets, etc.)  Uncontrolled diabetes or gestational diabetes requiring medication  Hypertension requiring medication  or diagnosis of pre-eclampsia  Heavy vaginal bleeding  Non-reassuring fetal heart rate  Active infection (MRSA, etc.). Group B Strep is NOT a contraindication for  waterbirth.  If your labor has to be induced and induction method requires continuous  monitoring of the baby's heart rate  Other risks/issues identified by your obstetrical provider  Please remember that birth is unpredictable. Under certain unforeseeable circumstances your provider may advise against giving birth in the tub. These decisions will be made on a case-by-case basis and with the safety of you and your baby as our highest priority.      

## 2019-01-24 LAB — OBSTETRIC PANEL, INCLUDING HIV
Antibody Screen: NEGATIVE
BASOS: 0 %
Basophils Absolute: 0 10*3/uL (ref 0.0–0.2)
EOS (ABSOLUTE): 0 10*3/uL (ref 0.0–0.4)
Eos: 0 %
HEMATOCRIT: 33 % — AB (ref 34.0–46.6)
HIV SCREEN 4TH GENERATION: NONREACTIVE
Hemoglobin: 11.9 g/dL (ref 11.1–15.9)
Hepatitis B Surface Ag: NEGATIVE
IMMATURE GRANS (ABS): 0 10*3/uL (ref 0.0–0.1)
Immature Granulocytes: 0 %
LYMPHS: 28 %
Lymphocytes Absolute: 1.2 10*3/uL (ref 0.7–3.1)
MCH: 32.2 pg (ref 26.6–33.0)
MCHC: 36.1 g/dL — AB (ref 31.5–35.7)
MCV: 89 fL (ref 79–97)
MONOS ABS: 0.3 10*3/uL (ref 0.1–0.9)
Monocytes: 8 %
Neutrophils Absolute: 2.7 10*3/uL (ref 1.4–7.0)
Neutrophils: 64 %
PLATELETS: 231 10*3/uL (ref 150–450)
RBC: 3.69 x10E6/uL — ABNORMAL LOW (ref 3.77–5.28)
RDW: 12.3 % (ref 11.7–15.4)
RPR Ser Ql: NONREACTIVE
Rh Factor: POSITIVE
Rubella Antibodies, IGG: 1.75 index (ref >0.99)
WBC: 4.2 10*3/uL (ref 3.4–10.8)

## 2019-01-26 LAB — URINE CULTURE, OB REFLEX

## 2019-01-26 LAB — CULTURE, OB URINE

## 2019-01-28 ENCOUNTER — Institutional Professional Consult (permissible substitution): Payer: Medicaid Other

## 2019-01-28 ENCOUNTER — Telehealth: Payer: Self-pay | Admitting: Clinical

## 2019-01-28 NOTE — Telephone Encounter (Signed)
Attempt telehealth visit: "We're sorry, we cannot complete your call at this time", no Voicemail, no message left.   My Chart is pending.   Pt may contact [facility] at [phone] to reschedule to see Integrated Behavioral Health Clinician Asher Muir, at her convenience.

## 2019-01-28 NOTE — BH Specialist Note (Deleted)
error 

## 2019-02-05 ENCOUNTER — Ambulatory Visit (HOSPITAL_COMMUNITY): Payer: Medicaid Other

## 2019-02-05 ENCOUNTER — Other Ambulatory Visit: Payer: Self-pay

## 2019-02-05 ENCOUNTER — Other Ambulatory Visit: Payer: Medicaid Other

## 2019-02-05 ENCOUNTER — Encounter: Payer: Self-pay | Admitting: Certified Nurse Midwife

## 2019-02-05 ENCOUNTER — Ambulatory Visit (INDEPENDENT_AMBULATORY_CARE_PROVIDER_SITE_OTHER): Payer: Medicaid Other | Admitting: Certified Nurse Midwife

## 2019-02-05 VITALS — BP 103/65 | HR 98 | Wt 173.9 lb

## 2019-02-05 DIAGNOSIS — O98913 Unspecified maternal infectious and parasitic disease complicating pregnancy, third trimester: Secondary | ICD-10-CM

## 2019-02-05 DIAGNOSIS — O98812 Other maternal infectious and parasitic diseases complicating pregnancy, second trimester: Secondary | ICD-10-CM

## 2019-02-05 DIAGNOSIS — O0933 Supervision of pregnancy with insufficient antenatal care, third trimester: Secondary | ICD-10-CM

## 2019-02-05 DIAGNOSIS — Z3480 Encounter for supervision of other normal pregnancy, unspecified trimester: Secondary | ICD-10-CM | POA: Diagnosis not present

## 2019-02-05 DIAGNOSIS — A749 Chlamydial infection, unspecified: Secondary | ICD-10-CM

## 2019-02-05 DIAGNOSIS — O0932 Supervision of pregnancy with insufficient antenatal care, second trimester: Secondary | ICD-10-CM

## 2019-02-05 DIAGNOSIS — Z8751 Personal history of pre-term labor: Secondary | ICD-10-CM

## 2019-02-05 DIAGNOSIS — O98813 Other maternal infectious and parasitic diseases complicating pregnancy, third trimester: Secondary | ICD-10-CM

## 2019-02-05 DIAGNOSIS — Z3A28 28 weeks gestation of pregnancy: Secondary | ICD-10-CM

## 2019-02-05 NOTE — Patient Instructions (Signed)

## 2019-02-05 NOTE — Progress Notes (Addendum)
   PRENATAL VISIT NOTE  Subjective:  Stacy Sellers is a 18 y.o. G2P0101 at [redacted]w[redacted]d being seen today for ongoing prenatal care.  She is currently monitored for the following issues for this high-risk pregnancy and has History of premature delivery; Chlamydia infection affecting pregnancy; Encounter for supervision of normal pregnancy, antepartum; and Late prenatal care complicating pregnancy in second trimester on their problem list.  Patient reports no complaints.  Contractions: Not present. Vag. Bleeding: None.  Movement: Present. Denies leaking of fluid.   The following portions of the patient's history were reviewed and updated as appropriate: allergies, current medications, past family history, past medical history, past social history, past surgical history and problem list.   Objective:   Vitals:   02/05/19 0957 02/05/19 1001  BP: 103/65 103/65  Pulse: 98 98  Weight: 173 lb 14.4 oz (78.9 kg) 173 lb 14.4 oz (78.9 kg)    Fetal Status: Fetal Heart Rate (bpm): 150   Movement: Present     General:  Alert, oriented and cooperative. Patient is in no acute distress.  Skin: Skin is warm and dry. No rash noted.   Cardiovascular: Normal heart rate noted  Respiratory: Normal respiratory effort, no problems with respiration noted  Abdomen: Soft, gravid, appropriate for gestational age.  Pain/Pressure: Absent     Pelvic: Cervical exam deferred        Extremities: Normal range of motion.  Edema: None  Mental Status: Normal mood and affect. Normal behavior. Normal judgment and thought content.   Assessment and Plan:  Pregnancy: G2P0101 at [redacted]w[redacted]d 1. Supervision of other normal pregnancy, antepartum - Patient doing well , no complaints  - Anticipatory guidance on upcoming appointments  - Patient reports eating shrimp and juice at 2am in the morning reports "forgetting GTT today", unable to do 2hr screening but completed 1hr GTT  - Anatomy US changed from today due to move from [facility] hospital  -  Educated and discussed babyscripts app, late enrollment into babyscripts due to social distancing, BP cuff given to patient  - Tdap vaccine greater than or equal to 7yo IM - CBC - HIV antibody (with reflex) - RPR - Glucose tolerance, 1 hour - Babyscripts Schedule Optimization  2. Chlamydia infection affecting pregnancy in second trimester - Patient completed medication last week, TOC needed for around 02/23/19  3. Late prenatal care complicating pregnancy in second trimester - Prenatal care initiated at 27 weeks   4. History of premature delivery - Too late for 17P  - preterm labor symptoms discussed   Preterm labor symptoms and general obstetric precautions including but not limited to vaginal bleeding, contractions, leaking of fluid and fetal movement were reviewed in detail with the patient. Please refer to After Visit Summary for other counseling recommendations.   Return in about 2 weeks (around 02/19/2019) for ROB-telehealth.  Future Appointments  Date Time Provider Department Center  02/05/2019  1:45 PM [facility] [place] 2 [facility]  02/19/2019  3:15 PM Hermina Staggers, MD [facility] None    Sharyon Cable, CNM

## 2019-02-05 NOTE — Progress Notes (Unsigned)
ROB/GTT. TDAP declined. 

## 2019-02-05 NOTE — Progress Notes (Signed)
ROB/GTT.  Declined TDAP. 

## 2019-02-06 LAB — CBC
Hematocrit: 33 % — ABNORMAL LOW (ref 34.0–46.6)
Hemoglobin: 11.3 g/dL (ref 11.1–15.9)
MCH: 31.9 pg (ref 26.6–33.0)
MCHC: 34.2 g/dL (ref 31.5–35.7)
MCV: 93 fL (ref 79–97)
Platelets: 219 10*3/uL (ref 150–450)
RBC: 3.54 x10E6/uL — ABNORMAL LOW (ref 3.77–5.28)
RDW: 13.2 % (ref 11.7–15.4)
WBC: 6.3 10*3/uL (ref 3.4–10.8)

## 2019-02-06 LAB — RPR: RPR Ser Ql: NONREACTIVE

## 2019-02-06 LAB — HIV ANTIBODY (ROUTINE TESTING W REFLEX): HIV Screen 4th Generation wRfx: NONREACTIVE

## 2019-02-06 LAB — GLUCOSE TOLERANCE, 1 HOUR: Glucose, 1Hr PP: 123 mg/dL (ref 65–199)

## 2019-02-11 ENCOUNTER — Other Ambulatory Visit (HOSPITAL_COMMUNITY): Payer: Medicaid Other

## 2019-02-19 ENCOUNTER — Encounter: Payer: Self-pay | Admitting: Obstetrics and Gynecology

## 2019-02-19 ENCOUNTER — Other Ambulatory Visit: Payer: Self-pay

## 2019-02-19 ENCOUNTER — Ambulatory Visit (INDEPENDENT_AMBULATORY_CARE_PROVIDER_SITE_OTHER): Payer: Medicaid Other | Admitting: Obstetrics

## 2019-02-19 ENCOUNTER — Ambulatory Visit: Payer: Medicaid Other | Admitting: Obstetrics and Gynecology

## 2019-02-19 ENCOUNTER — Encounter: Payer: Self-pay | Admitting: Obstetrics

## 2019-02-19 VITALS — BP 117/77 | HR 92

## 2019-02-19 DIAGNOSIS — Z3480 Encounter for supervision of other normal pregnancy, unspecified trimester: Secondary | ICD-10-CM

## 2019-02-19 DIAGNOSIS — Z8751 Personal history of pre-term labor: Secondary | ICD-10-CM

## 2019-02-19 DIAGNOSIS — O0993 Supervision of high risk pregnancy, unspecified, third trimester: Secondary | ICD-10-CM

## 2019-02-19 DIAGNOSIS — O09213 Supervision of pregnancy with history of pre-term labor, third trimester: Secondary | ICD-10-CM

## 2019-02-19 DIAGNOSIS — O0990 Supervision of high risk pregnancy, unspecified, unspecified trimester: Secondary | ICD-10-CM

## 2019-02-19 DIAGNOSIS — A749 Chlamydial infection, unspecified: Secondary | ICD-10-CM

## 2019-02-19 DIAGNOSIS — O0930 Supervision of pregnancy with insufficient antenatal care, unspecified trimester: Secondary | ICD-10-CM

## 2019-02-19 DIAGNOSIS — O98812 Other maternal infectious and parasitic diseases complicating pregnancy, second trimester: Secondary | ICD-10-CM

## 2019-02-19 DIAGNOSIS — Z3A30 30 weeks gestation of pregnancy: Secondary | ICD-10-CM

## 2019-02-19 DIAGNOSIS — O0933 Supervision of pregnancy with insufficient antenatal care, third trimester: Secondary | ICD-10-CM

## 2019-02-19 NOTE — Progress Notes (Signed)
Pt is on the phone for Webex visit with provider. [redacted]w[redacted]d.

## 2019-02-19 NOTE — Progress Notes (Signed)
Erroneous encounter

## 2019-02-19 NOTE — Progress Notes (Signed)
   TELEHEALTH [facility] VIRTUAL OBSTETRICS VISIT ENCOUNTER NOTE  I connected with Stacy Sellers on 02/19/19 at  3:30 PM EDT by [facility] at home and verified that I am speaking with the correct person using two identifiers.   I discussed the limitations, risks, security and privacy concerns of performing an evaluation and management service by telephone and the availability of in person appointments. I also discussed with the patient that there may be a patient responsible charge related to this service. The patient expressed understanding and agreed to proceed.  Subjective:  CORRINN Sellers is a 18 y.o. G2P0101 at [redacted]w[redacted]d being followed for ongoing prenatal care.  She is currently monitored for the following issues for this high-risk pregnancy and has History of premature delivery; Chlamydia infection affecting pregnancy; Encounter for supervision of normal pregnancy, antepartum; and Late prenatal care complicating pregnancy in second trimester on their problem list.  Patient reports backache and heartburn. Reports fetal movement. Denies any contractions, bleeding or leaking of fluid.   The following portions of the patient's history were reviewed and updated as appropriate: allergies, current medications, past family history, past medical history, past social history, past surgical history and problem list.   Objective:   General:  Alert, oriented and cooperative.   Mental Status: Normal mood and affect perceived. Normal judgment and thought content.  Rest of physical exam deferred due to type of encounter  Assessment and Plan:  Pregnancy: G2P0101 at [redacted]w[redacted]d 1. Supervision of high risk pregnancy, antepartum  2. History of premature delivery - clinically stable  3. Late prenatal care   Preterm labor symptoms and general obstetric precautions including but not limited to vaginal bleeding, contractions, leaking of fluid and fetal movement were reviewed in detail with the patient.  I discussed the  assessment and treatment plan with the patient. The patient was provided an opportunity to ask questions and all were answered. The patient agreed with the plan and demonstrated an understanding of the instructions. The patient was advised to call back or seek an in-person office evaluation/go to MAU at [facility] for any urgent or concerning symptoms. Please refer to After Visit Summary for other counseling recommendations.   I provided 10 minutes of non-face-to-face time during this encounter.  Return in about 2 weeks (around 03/05/2019) for [facility].  Future Appointments  Date Time Provider Department Center  02/26/2019  9:00 AM [facility] [place] 3 WH-M[facility]US [facility]-US  03/05/2019 10:15 AM Conan Bowens, MD [facility] None    Coral Ceo, MD Center for [facility], [facility] 02-19-2019

## 2019-02-26 ENCOUNTER — Other Ambulatory Visit: Payer: Self-pay

## 2019-02-26 ENCOUNTER — Ambulatory Visit (HOSPITAL_COMMUNITY)
Admission: RE | Admit: 2019-02-26 | Discharge: 2019-02-26 | Disposition: A | Discharge status: Home / Self Care | Payer: Medicaid Other | Source: Ambulatory Visit | Attending: Obstetrics and Gynecology | Admitting: Obstetrics and Gynecology

## 2019-02-26 DIAGNOSIS — Z363 Encounter for antenatal screening for malformations: Secondary | ICD-10-CM | POA: Diagnosis not present

## 2019-02-26 DIAGNOSIS — O0932 Supervision of pregnancy with insufficient antenatal care, second trimester: Secondary | ICD-10-CM | POA: Diagnosis not present

## 2019-02-26 DIAGNOSIS — Z3400 Encounter for supervision of normal first pregnancy, unspecified trimester: Secondary | ICD-10-CM | POA: Insufficient documentation

## 2019-02-26 DIAGNOSIS — O09213 Supervision of pregnancy with history of pre-term labor, third trimester: Secondary | ICD-10-CM | POA: Diagnosis not present

## 2019-02-26 DIAGNOSIS — O0933 Supervision of pregnancy with insufficient antenatal care, third trimester: Secondary | ICD-10-CM

## 2019-02-26 DIAGNOSIS — Z3A31 31 weeks gestation of pregnancy: Secondary | ICD-10-CM | POA: Diagnosis not present

## 2019-03-05 ENCOUNTER — Other Ambulatory Visit: Payer: Self-pay

## 2019-03-05 ENCOUNTER — Encounter: Payer: Medicaid Other | Admitting: Obstetrics and Gynecology

## 2019-03-11 ENCOUNTER — Telehealth: Payer: Self-pay | Admitting: Obstetrics and Gynecology

## 2019-03-12 ENCOUNTER — Other Ambulatory Visit: Payer: Self-pay

## 2019-03-12 ENCOUNTER — Ambulatory Visit (INDEPENDENT_AMBULATORY_CARE_PROVIDER_SITE_OTHER): Payer: Medicaid Other | Admitting: Obstetrics

## 2019-03-12 ENCOUNTER — Encounter: Payer: Self-pay | Admitting: Obstetrics

## 2019-03-12 VITALS — BP 130/79 | HR 89

## 2019-03-12 DIAGNOSIS — O0930 Supervision of pregnancy with insufficient antenatal care, unspecified trimester: Secondary | ICD-10-CM

## 2019-03-12 DIAGNOSIS — O0933 Supervision of pregnancy with insufficient antenatal care, third trimester: Secondary | ICD-10-CM

## 2019-03-12 DIAGNOSIS — O0993 Supervision of high risk pregnancy, unspecified, third trimester: Secondary | ICD-10-CM

## 2019-03-12 DIAGNOSIS — Z8751 Personal history of pre-term labor: Secondary | ICD-10-CM

## 2019-03-12 DIAGNOSIS — O0990 Supervision of high risk pregnancy, unspecified, unspecified trimester: Secondary | ICD-10-CM

## 2019-03-12 DIAGNOSIS — Z3A33 33 weeks gestation of pregnancy: Secondary | ICD-10-CM

## 2019-03-12 NOTE — Progress Notes (Signed)
   TELEHEALTH VIRTUAL OBSTETRICS PRENATAL VISIT ENCOUNTER NOTE  I connected with Stacy Sellers on 03/12/19 at  2:00 PM EDT by WebEx at home and verified that I am speaking with the correct person using two identifiers.   I discussed the limitations, risks, security and privacy concerns of performing an evaluation and management service by telephone and the availability of in person appointments. I also discussed with the patient that there may be a patient responsible charge related to this service. The patient expressed understanding and agreed to proceed. Subjective:  Stacy Sellers is a 18 y.o. G2P0101 at [redacted]w[redacted]d being seen today for ongoing prenatal care.  She is currently monitored for the following issues for this high-risk pregnancy and has History of premature delivery; Chlamydia infection affecting pregnancy; Encounter for supervision of normal pregnancy, antepartum; and Late prenatal care complicating pregnancy in second trimester on their problem list.  Patient reports backache.  Reports fetal movement. Contractions: Irritability. Vag. Bleeding: None.  Movement: Present. Denies any contractions, bleeding or leaking of fluid.   The following portions of the patient's history were reviewed and updated as appropriate: allergies, current medications, past family history, past medical history, past social history, past surgical history and problem list.   Objective:   Vitals:   03/12/19 1404  BP: 130/79  Pulse: 89    Fetal Status:     Movement: Present     General:  Alert, oriented and cooperative. Patient is in no acute distress.  Respiratory: Normal respiratory effort, no problems with respiration noted  Mental Status: Normal mood and affect. Normal behavior. Normal judgment and thought content.  Rest of physical exam deferred due to type of encounter  Assessment and Plan:  Pregnancy: G2P0101 at [redacted]w[redacted]d 1. Supervision of high risk pregnancy, antepartum  2. History of premature  delivery  3. Late prenatal care   Term labor symptoms and general obstetric precautions including but not limited to vaginal bleeding, contractions, leaking of fluid and fetal movement were reviewed in detail with the patient. I discussed the assessment and treatment plan with the patient. The patient was provided an opportunity to ask questions and all were answered. The patient agreed with the plan and demonstrated an understanding of the instructions. The patient was advised to call back or seek an in-person office evaluation/go to MAU at [facility] for any urgent or concerning symptoms. Please refer to After Visit Summary for other counseling recommendations.   I provided 10 minutes of face-to-face via WebEx time during this encounter.  Return in about 1 week (around 03/19/2019) for [facility].   Coral Ceo, MD Center for [facility], [facility] 03-12-2019

## 2019-03-12 NOTE — Progress Notes (Signed)
Pt presents for webex visit. Pt identified with two pt identifiers. She is [redacted]w[redacted]d. Her bp today is 130/79. Pt has no concerns.

## 2019-03-19 ENCOUNTER — Ambulatory Visit: Payer: Medicaid Other

## 2019-03-19 ENCOUNTER — Encounter: Payer: Medicaid Other | Admitting: Obstetrics

## 2019-03-19 ENCOUNTER — Other Ambulatory Visit: Payer: Self-pay

## 2019-03-19 NOTE — Progress Notes (Signed)
Unable to reach patient for virtual visit after speaking with the nurse. Will reschedule.  Rolm Bookbinder, CNM  03/19/19 4:00 PM

## 2019-03-19 NOTE — Progress Notes (Signed)
Pt is on the phone preparing for webex visit with provider. [redacted]w[redacted]d. Pt states she does have a BP cuff but is not able to check BP at this time because she is not home. Pt reports occasional HA's relieved by tylenol, pt denies any blurry vision or abdominal pain.    I advised the patient to connect to webex with provider and patient did not connect. Tried calling the patient twice over the span of the next 15 minutes after we spoke, the patient declined the call.

## 2019-03-24 ENCOUNTER — Other Ambulatory Visit: Payer: Self-pay

## 2019-03-24 ENCOUNTER — Ambulatory Visit (INDEPENDENT_AMBULATORY_CARE_PROVIDER_SITE_OTHER): Payer: Medicaid Other | Admitting: Obstetrics

## 2019-03-24 ENCOUNTER — Encounter: Payer: Self-pay | Admitting: Obstetrics

## 2019-03-24 VITALS — BP 139/93 | HR 94

## 2019-03-24 DIAGNOSIS — O0990 Supervision of high risk pregnancy, unspecified, unspecified trimester: Secondary | ICD-10-CM

## 2019-03-24 DIAGNOSIS — Z3A35 35 weeks gestation of pregnancy: Secondary | ICD-10-CM

## 2019-03-24 DIAGNOSIS — O09213 Supervision of pregnancy with history of pre-term labor, third trimester: Secondary | ICD-10-CM

## 2019-03-24 DIAGNOSIS — A749 Chlamydial infection, unspecified: Secondary | ICD-10-CM

## 2019-03-24 DIAGNOSIS — O0933 Supervision of pregnancy with insufficient antenatal care, third trimester: Secondary | ICD-10-CM

## 2019-03-24 DIAGNOSIS — Z8751 Personal history of pre-term labor: Secondary | ICD-10-CM

## 2019-03-24 DIAGNOSIS — O0930 Supervision of pregnancy with insufficient antenatal care, unspecified trimester: Secondary | ICD-10-CM

## 2019-03-24 DIAGNOSIS — O98813 Other maternal infectious and parasitic diseases complicating pregnancy, third trimester: Secondary | ICD-10-CM

## 2019-03-24 DIAGNOSIS — O98819 Other maternal infectious and parasitic diseases complicating pregnancy, unspecified trimester: Secondary | ICD-10-CM

## 2019-03-24 DIAGNOSIS — O0993 Supervision of high risk pregnancy, unspecified, third trimester: Secondary | ICD-10-CM

## 2019-03-24 NOTE — Progress Notes (Signed)
TELEHEALTH OBSTETRICS PRENATAL VIRTUAL VIDEO VISIT ENCOUNTER NOTE  Provider location: [facility] [place]   I connected with Stacy Sellers on 03/24/19 at  2:15 PM EDT by WebEx OB MyChart Video Encounter at home and verified that I am speaking with the correct person using two identifiers.   I discussed the limitations, risks, security and privacy concerns of performing an evaluation and management service by telephone and the availability of in person appointments. I also discussed with the patient that there may be a patient responsible charge related to this service. The patient expressed understanding and agreed to proceed. Subjective:  Stacy Sellers is a 18 y.o. G2P0101 at [redacted]w[redacted]d being seen today for ongoing prenatal care.  She is currently monitored for the following issues for this high-risk pregnancy and has History of premature delivery; Chlamydia infection affecting pregnancy; Encounter for supervision of normal pregnancy, antepartum; and Late prenatal care complicating pregnancy in second trimester on their problem list.  Patient reports no complaints and vaginal discharge without odor or irritation.  Contractions: Irritability. Vag. Bleeding: None.  Movement: Present. Denies any leaking of fluid.   The following portions of the patient's history were reviewed and updated as appropriate: allergies, current medications, past family history, past medical history, past social history, past surgical history and problem list.   Objective:   Vitals:   03/24/19 1420  BP: (!) 139/93  Pulse: 94    Fetal Status:     Movement: Present     General:  Alert, oriented and cooperative. Patient is in no acute distress.  Respiratory: Normal respiratory effort, no problems with respiration noted  Mental Status: Normal mood and affect. Normal behavior. Normal judgment and thought content.  Rest of physical exam deferred due to type of encounter  Imaging: [place] Mfm Ob Detail +14  Wk  Result Date: 02/26/2019 ----------------------------------------------------------------------  OBSTETRICS REPORT                       (Signed Final 02/26/2019 10:25 pm) ---------------------------------------------------------------------- Patient Info  ID #:       [id number]                          D.O.B.:  2001/02/07 (18 yrs)  Name:       Stacy Sellers                    Visit Date: 02/26/2019 09:46 am ---------------------------------------------------------------------- Performed By  Performed By:     Percell Boston          Ref. Address:      [address]  [address]                                                              [place]                                                              [address]  Attending:        Lin Landsman      [facility]  Referred By:      [facility] ---------------------------------------------------------------------- Orders   #  Description                          Code         Ordered By   1  [place] MFM OB DETAIL +14 WK              76811.01     Vonzella Nipple  ----------------------------------------------------------------------   #  Order #                    Accession #                 Episode #   1  [id number]                  [id number]                  [id number]  ---------------------------------------------------------------------- Indications   [redacted] weeks gestation of pregnancy                Z3A.31   Encounter for antenatal screening for          Z36.3   malformations (no testing in chart)   Late to prenatal care, third trimester         O09.33   Insufficient Prenatal Care                     O09.30   Poor obstetric history: Previous preterm       O09.219   delivery, antepartum ([redacted]w[redacted]d)   ---------------------------------------------------------------------- Fetal Evaluation  Num Of Fetuses:          1  Fetal Heart Rate(bpm):   128  Cardiac Activity:        Observed  Presentation:            Cephalic  Placenta:                Posterior  P. Cord Insertion:       Visualized, central  Amniotic Fluid  AFI FV:      Within normal limits  AFI Sum(cm)     %  Tile       Largest Pocket(cm)  12.46           35          4.56  RUQ(cm)       RLQ(cm)       LUQ(cm)        LLQ(cm)  3.55          0             4.56           4.35 ---------------------------------------------------------------------- Biometry  BPD:      78.9  mm     G. Age:  31w 5d         35  %    CI:        71.22   %    70 - 86                                                          FL/HC:       19.6  %    19.1 - 21.3  HC:      297.8  mm     G. Age:  33w 0d         43  %    HC/AC:       1.09       0.96 - 1.17  AC:      273.4  mm     G. Age:  31w 3d         36  %    FL/BPD:      74.1  %    71 - 87  FL:       58.5  mm     G. Age:  30w 4d         10  %    FL/AC:       21.4  %    20 - 24  HUM:      52.3  mm     G. Age:  30w 3d         23  %  CER:        39  mm     G. Age:  33w 0d         70  %  CM:        9.6  mm  Est. FW:    1744   gm   3 lb 14 oz      46  % ---------------------------------------------------------------------- OB History  Gravidity:    2         Term:   0        Prem:   1        SAB:   0  TOP:          0       Ectopic:  0        Living: 1 ---------------------------------------------------------------------- Gestational Age  U/S Today:     31w 5d                                        EDD:   04/25/19  Best:  31w 6d     Det. ByMarcella Dubs         EDD:   04/24/19                                      (09/02/18) ---------------------------------------------------------------------- Anatomy  Cranium:               Appears normal         Aortic Arch:            Not well visualized  Cavum:                 Appears normal          Ductal Arch:            Not well visualized  Ventricles:            Appears normal         Diaphragm:              Appears normal  Choroid Plexus:        Appears normal         Stomach:                Appears normal, left                                                                        sided  Cerebellum:            Appears normal         Abdomen:                Appears normal  Posterior Fossa:       Appears normal         Abdominal Wall:         Appears nml (cord                                                                        insert, abd wall)  Nuchal Fold:           Not applicable (>20    Cord Vessels:           Appears normal ([redacted]                         wks GA)                                        vessel cord)  Face:                  Appears normal         Kidneys:                Appear normal                         (  orbits and profile)  Lips:                  Appears normal         Bladder:                Appears normal  Thoracic:              Appears normal         Spine:                  Appears normal  Heart:                 Appears normal         Upper Extremities:      Appears normal                         (4CH, axis, and                         situs)  RVOT:                  Appears normal         Lower Extremities:      Appears normal  LVOT:                  Appears normal  Other:  Fetus appears to be a female. 3VV visualized. Technically difficult due          to advanced gestational age. ---------------------------------------------------------------------- Cervix Uterus Adnexa  Cervix  Not visualized (advanced GA >24wks)  Uterus  No abnormality visualized.  Left Ovary  No adnexal mass visualized.  Right Ovary  No adnexal mass visualized.  Cul De Sac  No free fluid seen.  Adnexa  No abnormality visualized. ---------------------------------------------------------------------- Impression  Normal interval growth.  No ultrasonic evidence of structural  fetal anomalies.  Suboptimal views of the  fetal anatomy obtatined secondary to  fetal gestational age. ---------------------------------------------------------------------- Recommendations  Follow up growth as clinically ----------------------------------------------------------------------               Lin Landsmanorenthian Booker, MD Electronically Signed Final Report   02/26/2019 10:25 pm ----------------------------------------------------------------------   Assessment and Plan:  Pregnancy: G2P0101 at 3222w4d 1. Supervision of high risk pregnancy, antepartum  2. History of premature delivery   3. Late prenatal care  4. Chlamydia infection affecting pregnancy, antepartum - repeat cultures next week    Preterm labor symptoms and general obstetric precautions including but not limited to vaginal bleeding, contractions, leaking of fluid and fetal movement were reviewed in detail with the patient. I discussed the assessment and treatment plan with the patient. The patient was provided an opportunity to ask questions and all were answered. The patient agreed with the plan and demonstrated an understanding of the instructions. The patient was advised to call back or seek an in-person office evaluation/[facility] for any urgent or concerning symptoms. Please refer to After Visit Summary for other counseling recommendations.   I provided 10 minutes of face-to-face time during this encounter.  Return in about 1 week (around 03/31/2019) [facility]  GBS.  No future appointments.  Coral Ceoharles Harper, MD [facility], [facility] 03-24-2019

## 2019-03-24 NOTE — Progress Notes (Signed)
ROB/Webex.  C/o thick, white discharge without odor.

## 2019-03-29 ENCOUNTER — Other Ambulatory Visit: Payer: Self-pay

## 2019-03-29 ENCOUNTER — Inpatient Hospital Stay (HOSPITAL_COMMUNITY)
Admission: AD | Admit: 2019-03-29 | Discharge: 2019-03-29 | Disposition: A | Discharge status: Home / Self Care | Payer: Medicaid Other | Attending: Family Medicine | Admitting: Family Medicine

## 2019-03-29 ENCOUNTER — Encounter (HOSPITAL_COMMUNITY): Payer: Self-pay | Admitting: Advanced Practice Midwife

## 2019-03-29 DIAGNOSIS — O26893 Other specified pregnancy related conditions, third trimester: Secondary | ICD-10-CM | POA: Insufficient documentation

## 2019-03-29 DIAGNOSIS — R109 Unspecified abdominal pain: Secondary | ICD-10-CM | POA: Insufficient documentation

## 2019-03-29 DIAGNOSIS — O23593 Infection of other part of genital tract in pregnancy, third trimester: Secondary | ICD-10-CM | POA: Diagnosis not present

## 2019-03-29 DIAGNOSIS — Z3A36 36 weeks gestation of pregnancy: Secondary | ICD-10-CM | POA: Insufficient documentation

## 2019-03-29 DIAGNOSIS — O98813 Other maternal infectious and parasitic diseases complicating pregnancy, third trimester: Secondary | ICD-10-CM

## 2019-03-29 DIAGNOSIS — Z3689 Encounter for other specified antenatal screening: Secondary | ICD-10-CM | POA: Diagnosis not present

## 2019-03-29 DIAGNOSIS — B9689 Other specified bacterial agents as the cause of diseases classified elsewhere: Secondary | ICD-10-CM | POA: Diagnosis not present

## 2019-03-29 DIAGNOSIS — A749 Chlamydial infection, unspecified: Secondary | ICD-10-CM

## 2019-03-29 DIAGNOSIS — M549 Dorsalgia, unspecified: Secondary | ICD-10-CM | POA: Diagnosis not present

## 2019-03-29 DIAGNOSIS — O98812 Other maternal infectious and parasitic diseases complicating pregnancy, second trimester: Secondary | ICD-10-CM

## 2019-03-29 DIAGNOSIS — N760 Acute vaginitis: Secondary | ICD-10-CM | POA: Diagnosis not present

## 2019-03-29 LAB — URINALYSIS, ROUTINE W REFLEX MICROSCOPIC
Bacteria, UA: NONE SEEN
Bilirubin Urine: NEGATIVE
Glucose, UA: NEGATIVE mg/dL
Hgb urine dipstick: NEGATIVE
Ketones, ur: NEGATIVE mg/dL
Nitrite: NEGATIVE
Protein, ur: NEGATIVE mg/dL
Specific Gravity, Urine: 1.012 (ref 1.005–1.030)
pH: 8 (ref 5.0–8.0)

## 2019-03-29 LAB — WET PREP, GENITAL
Trich, Wet Prep: NONE SEEN
Yeast Wet Prep HPF POC: NONE SEEN

## 2019-03-29 MED ORDER — ACETAMINOPHEN ER 650 MG PO TBCR: 650.0000 mg | EXTENDED_RELEASE_TABLET | ORAL | 0 refills | Status: DC | PRN

## 2019-03-29 MED ORDER — METRONIDAZOLE 500 MG PO TABS: 500.0000 mg | ORAL_TABLET | Freq: Two times a day (BID) | ORAL | 0 refills | Status: DC

## 2019-03-29 MED ORDER — ACETAMINOPHEN 500 MG PO TABS
1000.0000 mg | ORAL_TABLET | Freq: Once | ORAL | Status: AC
  Administered 2019-03-29: 16:00:00 1000 mg via ORAL
  Filled 2019-03-29: qty 2

## 2019-03-29 NOTE — MAU Note (Signed)
Pt reports pinkish discharge, which she noticed about an hour ago when she used the bathroom. Was feeling sharp back pain last night and pressure in lower abdomen 7/10 on pain scale. Pt states she has a doctor's appointment that was scheduled for Tuesday.

## 2019-03-29 NOTE — Discharge Instructions (Signed)

## 2019-03-29 NOTE — MAU Provider Note (Signed)
History     CSN: [id number]  Arrival date and time: 03/29/19 1451   None     No chief complaint on file.  HPI Stacy Sellers is a 18 y.o. G2P0101 at 3671w2d who presents to MAU with chief complaints of preterm contractions and bloody vaginal discharge. She denies overt vaginal bleeding and decreased fetal movement.   Red-Tinged Vaginal Discharge Patient reports passing a "large clump of mucous" with a "tinge of red like you're about to start your period" about 30 minutes prior to signing in at MAU. She denies seeing any additional bleeding since that time. Patient's history includes diagnosis and treatment for Chlamydia in March 2020. She reports she and her partner were both treated and they are no longer together. She denies frank red vaginal bleeding, vaginal itching or irritation and recent intercourse.  Abdominal and Back Pain This is a recurring problem, onset with third trimester. Patient has recurrent abdominal and back pain related to her work with a [employer], worsening as her pregnancy progresses. She reports recurrent "sore" sensation in her lower abdomen and radiating to her low back. Her discomfort waxes and wanes throughout the day. She denies aggravating or alleviating factors. She typically manages her discomfort with Tylenol but has not taken medication or tried other treatments for this complaint.  Patient initiated prenatal care at 27 weeks, too late for weekly 17 P injections.   OB History    Gravida  2   Para  1   Term      Preterm  1   AB      Living  1     SAB      TAB      Ectopic      Multiple  0   Live Births  1          Patient Active Problem List   Diagnosis Date Noted  . Encounter for supervision of normal pregnancy, antepartum 01/23/2019  . Late prenatal care complicating pregnancy in second trimester 01/23/2019  . Chlamydia infection affecting pregnancy 01/06/2019  . History of premature delivery 12/30/2018   Past Medical  History:  Diagnosis Date  . Medical history non-contributory     Past Surgical History:  Procedure Laterality Date  . NO PAST SURGERIES      Family History  Problem Relation Age of Onset  . Hypertension Maternal Grandmother     Social History   Tobacco Use  . Smoking status: Never Smoker  . Smokeless tobacco: Never Used  Substance Use Topics  . Alcohol use: No  . Drug use: No    Allergies: No Known Allergies  Medications Prior to Admission  Medication Sig Dispense Refill Last Dose  . Prenatal Vit-Fe Fumarate-FA (PRENATAL COMPLETE) 14-0.4 MG TABS Take 1 tablet by mouth daily. 60 each 0 Taking    Review of Systems  Constitutional: Negative for chills, fatigue and fever.  Respiratory: Negative for cough and shortness of breath.   Gastrointestinal: Positive for abdominal pain.  Genitourinary: Positive for vaginal discharge. Negative for vaginal bleeding and vaginal pain.  Musculoskeletal: Negative for back pain.  Neurological: Negative for headaches.  All other systems reviewed and are negative.  Physical Exam   Weight 81.3 kg, last menstrual period 07/10/2018, unknown if currently breastfeeding.  Physical Exam  Nursing note and vitals reviewed. Constitutional: She is oriented to person, place, and time. She appears well-developed and well-nourished.  Cardiovascular: Normal rate.  Respiratory: Effort normal.  GI: Soft. She exhibits no distension.  There is no abdominal tenderness. There is no rebound, no guarding and no CVA tenderness.  Gravid  Genitourinary:    Vaginal discharge present.     Genitourinary Comments: Cervical os covered in greenish white discharge   Neurological: She is alert and oriented to person, place, and time.  Skin: Skin is warm and dry.  Psychiatric: She has a normal mood and affect. Her behavior is normal. Judgment and thought content normal.    MAU Course/MDM  Procedures  --Reactive tracing: baseline 135, moderate variability,  positive accels, no decels --Toco: Occasional contractions with UI --Cervix closed --Patient declined offer of IV fluids --Patient has not had test of cure s/p Chlamydia treatment on 01/23/19 --Large leukocytes on UA, elevated skin cells point to possible contaminated culture. OB urine culture ordered --Patient reports pain relief with Tylenol given in MAU  Patient Vitals for the past 24 hrs:  BP Temp Temp src Pulse Resp SpO2 Height Weight  03/29/19 1520 116/63 98.6 F (37 C) Oral (!) 102 18 99 % - -  03/29/19 1504 - - - - - - 5\' 3"  (1.6 m) 81.3 kg    Results for orders placed or performed during the hospital encounter of 03/29/19 (from the past 24 hour(s))  Urinalysis, Routine w reflex microscopic     Status: Abnormal   Collection Time: 03/29/19  3:23 PM  Result Value Ref Range   Color, Urine YELLOW YELLOW   APPearance CLEAR CLEAR   Specific Gravity, Urine 1.012 1.005 - 1.030   pH 8.0 5.0 - 8.0   Glucose, UA NEGATIVE NEGATIVE mg/dL   Hgb urine dipstick NEGATIVE NEGATIVE   Bilirubin Urine NEGATIVE NEGATIVE   Ketones, ur NEGATIVE NEGATIVE mg/dL   Protein, ur NEGATIVE NEGATIVE mg/dL   Nitrite NEGATIVE NEGATIVE   Leukocytes,Ua LARGE (A) NEGATIVE   RBC / HPF 0-5 0 - 5 RBC/hpf   WBC, UA 0-5 0 - 5 WBC/hpf   Bacteria, UA NONE SEEN NONE SEEN   Squamous Epithelial / LPF 6-10 0 - 5   Mucus PRESENT   Wet prep, genital     Status: Abnormal   Collection Time: 03/29/19  3:44 PM  Result Value Ref Range   Yeast Wet Prep HPF POC NONE SEEN NONE SEEN   Trich, Wet Prep NONE SEEN NONE SEEN   Clue Cells Wet Prep HPF POC PRESENT (A) NONE SEEN   WBC, Wet Prep HPF POC MANY (A) NONE SEEN   Sperm NO CHARGE     Meds ordered this encounter  Medications  . acetaminophen (TYLENOL) tablet 1,000 mg  . acetaminophen (TYLENOL 8 HOUR) 650 MG CR tablet    Sig: Take 1 tablet (650 mg total) by mouth every 4 (four) hours as needed for pain.    Dispense:  300 tablet    Refill:  0    Order Specific Question:    Supervising Provider    Answer:   Reva Bores [[id number]]  . metroNIDAZOLE (FLAGYL) 500 MG tablet    Sig: Take 1 tablet (500 mg total) by mouth 2 (two) times daily.    Dispense:  14 tablet    Refill:  0    Order Specific Question:   Supervising Provider    Answer:   Reva Bores [[id number]]   Assessment and Plan  --18 y.o. G2P0101 at [redacted]w[redacted]d  --Reactive tracing --BV, rx to pharmacy --Continue Tylenol for back pain --Urine culture pending --Discharge home in stable condition  F/U: Patient has appt at [facility]  03/31/2019  Calvert Cantor, CNM 03/29/2019, 5:35 PM

## 2019-03-30 LAB — GC/CHLAMYDIA PROBE AMP (~~LOC~~) NOT AT ARMC
Chlamydia: NEGATIVE
Neisseria Gonorrhea: NEGATIVE

## 2019-03-30 LAB — CULTURE, OB URINE: Culture: NO GROWTH

## 2019-03-31 ENCOUNTER — Ambulatory Visit (INDEPENDENT_AMBULATORY_CARE_PROVIDER_SITE_OTHER): Payer: Medicaid Other | Admitting: Obstetrics and Gynecology

## 2019-03-31 ENCOUNTER — Other Ambulatory Visit: Payer: Self-pay

## 2019-03-31 VITALS — BP 112/73 | HR 96 | Wt 177.0 lb

## 2019-03-31 DIAGNOSIS — Z3A36 36 weeks gestation of pregnancy: Secondary | ICD-10-CM

## 2019-03-31 DIAGNOSIS — Z3480 Encounter for supervision of other normal pregnancy, unspecified trimester: Secondary | ICD-10-CM | POA: Diagnosis not present

## 2019-03-31 DIAGNOSIS — A749 Chlamydial infection, unspecified: Secondary | ICD-10-CM

## 2019-03-31 DIAGNOSIS — O98812 Other maternal infectious and parasitic diseases complicating pregnancy, second trimester: Secondary | ICD-10-CM

## 2019-03-31 DIAGNOSIS — Z8751 Personal history of pre-term labor: Secondary | ICD-10-CM

## 2019-03-31 NOTE — Patient Instructions (Signed)
Third Trimester of Pregnancy The third trimester is from week 28 through week 40 (months 7 through 9). The third trimester is a time when the unborn baby (fetus) is growing rapidly. At the end of the ninth month, the fetus is about 20 inches in length and weighs 6-10 pounds. Body changes during your third trimester Your body will continue to go through many changes during pregnancy. The changes vary from woman to woman. During the third trimester:  Your weight will continue to increase. You can expect to gain 25-35 pounds (11-16 kg) by the end of the pregnancy.  You may begin to get stretch marks on your hips, abdomen, and breasts.  You may urinate more often because the fetus is moving lower into your pelvis and pressing on your bladder.  You may develop or continue to have heartburn. This is caused by increased hormones that slow down muscles in the digestive tract.  You may develop or continue to have constipation because increased hormones slow digestion and cause the muscles that push waste through your intestines to relax.  You may develop hemorrhoids. These are swollen veins (varicose veins) in the rectum that can itch or be painful.  You may develop swollen, bulging veins (varicose veins) in your legs.  You may have increased body aches in the pelvis, back, or thighs. This is due to weight gain and increased hormones that are relaxing your joints.  You may have changes in your hair. These can include thickening of your hair, rapid growth, and changes in texture. Some women also have hair loss during or after pregnancy, or hair that feels dry or thin. Your hair will most likely return to normal after your baby is born.  Your breasts will continue to grow and they will continue to become tender. A yellow fluid (colostrum) may leak from your breasts. This is the first milk you are producing for your baby.  Your belly button may stick out.  You may notice more swelling in your hands,  face, or ankles.  You may have increased tingling or numbness in your hands, arms, and legs. The skin on your belly may also feel numb.  You may feel short of breath because of your expanding uterus.  You may have more problems sleeping. This can be caused by the size of your belly, increased need to urinate, and an increase in your body's metabolism.  You may notice the fetus "dropping," or moving lower in your abdomen (lightening).  You may have increased vaginal discharge.  You may notice your joints feel loose and you may have pain around your pelvic bone. What to expect at prenatal visits You will have prenatal exams every 2 weeks until week 36. Then you will have weekly prenatal exams. During a routine prenatal visit:  You will be weighed to make sure you and the baby are growing normally.  Your blood pressure will be taken.  Your abdomen will be measured to track your baby's growth.  The fetal heartbeat will be listened to.  Any test results from the previous visit will be discussed.  You may have a cervical check near your due date to see if your cervix has softened or thinned (effaced).  You will be tested for Group B streptococcus. This happens between 35 and 37 weeks. Your health care provider may ask you:  What your birth plan is.  How you are feeling.  If you are feeling the baby move.  If you have had any abnormal   symptoms, such as leaking fluid, bleeding, severe headaches, or abdominal cramping.  If you are using any tobacco products, including cigarettes, chewing tobacco, and electronic cigarettes.  If you have any questions. Other tests or screenings that may be performed during your third trimester include:  Blood tests that check for low iron levels (anemia).  Fetal testing to check the health, activity level, and growth of the fetus. Testing is done if you have certain medical conditions or if there are problems during the pregnancy.  Nonstress test  (NST). This test checks the health of your baby to make sure there are no signs of problems, such as the baby not getting enough oxygen. During this test, a belt is placed around your belly. The baby is made to move, and its heart rate is monitored during movement. What is false labor? False labor is a condition in which you feel small, irregular tightenings of the muscles in the womb (contractions) that usually go away with rest, changing position, or drinking water. These are called Braxton Hicks contractions. Contractions may last for hours, days, or even weeks before true labor sets in. If contractions come at regular intervals, become more frequent, increase in intensity, or become painful, you should see your health care provider. What are the signs of labor?  Abdominal cramps.  Regular contractions that start at 10 minutes apart and become stronger and more frequent with time.  Contractions that start on the top of the uterus and spread down to the lower abdomen and back.  Increased pelvic pressure and dull back pain.  A watery or bloody mucus discharge that comes from the vagina.  Leaking of amniotic fluid. This is also known as your "water breaking." It could be a slow trickle or a gush. Let your health care provider know if it has a color or strange odor. If you have any of these signs, call your health care provider right away, even if it is before your due date. Follow these instructions at home: Medicines  Follow your health care provider's instructions regarding medicine use. Specific medicines may be either safe or unsafe to take during pregnancy.  Take a prenatal vitamin that contains at least 600 micrograms (mcg) of folic acid.  If you develop constipation, try taking a stool softener if your health care provider approves. Eating and drinking   Eat a balanced diet that includes fresh fruits and vegetables, whole grains, good sources of protein such as meat, eggs, or tofu,  and low-fat dairy. Your health care provider will help you determine the amount of weight gain that is right for you.  Avoid raw meat and uncooked cheese. These carry germs that can cause birth defects in the baby.  If you have low calcium intake from food, talk to your health care provider about whether you should take a daily calcium supplement.  Eat four or five small meals rather than three large meals a day.  Limit foods that are high in fat and processed sugars, such as fried and sweet foods.  To prevent constipation: ? Drink enough fluid to keep your urine clear or pale yellow. ? Eat foods that are high in fiber, such as fresh fruits and vegetables, whole grains, and beans. Activity  Exercise only as directed by your health care provider. Most women can continue their usual exercise routine during pregnancy. Try to exercise for 30 minutes at least 5 days a week. Stop exercising if you experience uterine contractions.  Avoid heavy lifting.  Do   not exercise in extreme heat or humidity, or at high altitudes.  Wear low-heel, comfortable shoes.  Practice good posture.  You may continue to have sex unless your health care provider tells you otherwise. Relieving pain and discomfort  Take frequent breaks and rest with your legs elevated if you have leg cramps or low back pain.  Take warm sitz baths to soothe any pain or discomfort caused by hemorrhoids. Use hemorrhoid cream if your health care provider approves.  Wear a good support bra to prevent discomfort from breast tenderness.  If you develop varicose veins: ? Wear support pantyhose or compression stockings as told by your healthcare provider. ? Elevate your feet for 15 minutes, 3-4 times a day. Prenatal care  Write down your questions. Take them to your prenatal visits.  Keep all your prenatal visits as told by your health care provider. This is important. Safety  Wear your seat belt at all times when driving.  Make  a list of emergency phone numbers, including numbers for family, friends, the hospital, and police and fire departments. General instructions  Avoid cat litter boxes and soil used by cats. These carry germs that can cause birth defects in the baby. If you have a cat, ask someone to clean the litter box for you.  Do not travel far distances unless it is absolutely necessary and only with the approval of your health care provider.  Do not use hot tubs, steam rooms, or saunas.  Do not drink alcohol.  Do not use any products that contain nicotine or tobacco, such as cigarettes and e-cigarettes. If you need help quitting, ask your health care provider.  Do not use any medicinal herbs or unprescribed drugs. These chemicals affect the formation and growth of the baby.  Do not douche or use tampons or scented sanitary pads.  Do not cross your legs for long periods of time.  To prepare for the arrival of your baby: ? Take prenatal classes to understand, practice, and ask questions about labor and delivery. ? Make a trial run to the hospital. ? Visit the hospital and tour the maternity area. ? Arrange for maternity or paternity leave through employers. ? Arrange for family and friends to take care of pets while you are in the hospital. ? Purchase a rear-facing car seat and make sure you know how to install it in your car. ? Pack your hospital bag. ? Prepare the baby's nursery. Make sure to remove all pillows and stuffed animals from the baby's crib to prevent suffocation.  Visit your dentist if you have not gone during your pregnancy. Use a soft toothbrush to brush your teeth and be gentle when you floss. Contact a health care provider if:  You are unsure if you are in labor or if your water has broken.  You become dizzy.  You have mild pelvic cramps, pelvic pressure, or nagging pain in your abdominal area.  You have lower back pain.  You have persistent nausea, vomiting, or  diarrhea.  You have an unusual or bad smelling vaginal discharge.  You have pain when you urinate. Get help right away if:  Your water breaks before 37 weeks.  You have regular contractions less than 5 minutes apart before 37 weeks.  You have a fever.  You are leaking fluid from your vagina.  You have spotting or bleeding from your vagina.  You have severe abdominal pain or cramping.  You have rapid weight loss or weight gain.  You have   shortness of breath with chest pain.  You notice sudden or extreme swelling of your face, hands, ankles, feet, or legs.  Your baby makes fewer than 10 movements in 2 hours.  You have severe headaches that do not go away when you take medicine.  You have vision changes. Summary  The third trimester is from week 28 through week 40, months 7 through 9. The third trimester is a time when the unborn baby (fetus) is growing rapidly.  During the third trimester, your discomfort may increase as you and your baby continue to gain weight. You may have abdominal, leg, and back pain, sleeping problems, and an increased need to urinate.  During the third trimester your breasts will keep growing and they will continue to become tender. A yellow fluid (colostrum) may leak from your breasts. This is the first milk you are producing for your baby.  False labor is a condition in which you feel small, irregular tightenings of the muscles in the womb (contractions) that eventually go away. These are called Braxton Hicks contractions. Contractions may last for hours, days, or even weeks before true labor sets in.  Signs of labor can include: abdominal cramps; regular contractions that start at 10 minutes apart and become stronger and more frequent with time; watery or bloody mucus discharge that comes from the vagina; increased pelvic pressure and dull back pain; and leaking of amniotic fluid. This information is not intended to replace advice given to you by your  health care provider. Make sure you discuss any questions you have with your health care provider. Document Released: 10/09/2001 Document Revised: 11/20/2016 Document Reviewed: 11/20/2016 Elsevier Interactive Patient Education  2019 [employer] Inc[employer]  

## 2019-03-31 NOTE — Progress Notes (Signed)
Subjective:  Stacy Sellers is a 18 y.o. G2P0101 at [redacted]w[redacted]d being seen today for ongoing prenatal care.  She is currently monitored for the following issues for this low-risk pregnancy and has History of premature delivery; Chlamydia infection affecting pregnancy; Encounter for supervision of normal pregnancy, antepartum; and Late prenatal care complicating pregnancy in second trimester on their problem list.  Patient reports general discomforts of pregancy plus occ ut ctx and vaginal pressure.  Contractions: Not present. Vag. Bleeding: None.  Movement: Present. Denies leaking of fluid.   The following portions of the patient's history were reviewed and updated as appropriate: allergies, current medications, past family history, past medical history, past social history, past surgical history and problem list. Problem list updated.  Objective:   Vitals:   03/31/19 1454  BP: 112/73  Pulse: 96  Weight: 177 lb (80.3 kg)    Fetal Status: Fetal Heart Rate (bpm): 140   Movement: Present     General:  Alert, oriented and cooperative. Patient is in no acute distress.  Skin: Skin is warm and dry. No rash noted.   Cardiovascular: Normal heart rate noted  Respiratory: Normal respiratory effort, no problems with respiration noted  Abdomen: Soft, gravid, appropriate for gestational age. Pain/Pressure: Present     Pelvic:  Cervical exam performed        Extremities: Normal range of motion.     Mental Status: Normal mood and affect. Normal behavior. Normal judgment and thought content.   Urinalysis:      Assessment and Plan:  Pregnancy: G2P0101 at [redacted]w[redacted]d  1. Supervision of other normal pregnancy, antepartum Labor precautions GBS collected GC/C negative 03/29/19 at MAU visit  2. History of premature delivery   3. Chlamydia infection affecting pregnancy in second trimester Negative TOC 03/29/19  Term labor symptoms and general obstetric precautions including but not limited to vaginal bleeding,  contractions, leaking of fluid and fetal movement were reviewed in detail with the patient. Please refer to After Visit Summary for other counseling recommendations.  Return in about 2 weeks (around 04/14/2019) for OB visit, virtual.   Hermina Staggers, MD

## 2019-04-02 LAB — STREP GP B NAA: Strep Gp B NAA: NEGATIVE

## 2019-04-07 ENCOUNTER — Inpatient Hospital Stay (HOSPITAL_COMMUNITY)
Admission: AD | Admit: 2019-04-07 | Discharge: 2019-04-07 | Disposition: A | Discharge status: Home / Self Care | Payer: Medicaid Other | Attending: Obstetrics & Gynecology | Admitting: Obstetrics & Gynecology

## 2019-04-07 ENCOUNTER — Encounter (HOSPITAL_COMMUNITY): Payer: Self-pay

## 2019-04-07 ENCOUNTER — Other Ambulatory Visit: Payer: Self-pay

## 2019-04-07 DIAGNOSIS — O26893 Other specified pregnancy related conditions, third trimester: Secondary | ICD-10-CM

## 2019-04-07 DIAGNOSIS — Z0371 Encounter for suspected problem with amniotic cavity and membrane ruled out: Secondary | ICD-10-CM | POA: Diagnosis not present

## 2019-04-07 DIAGNOSIS — Z3689 Encounter for other specified antenatal screening: Secondary | ICD-10-CM

## 2019-04-07 DIAGNOSIS — N898 Other specified noninflammatory disorders of vagina: Secondary | ICD-10-CM | POA: Insufficient documentation

## 2019-04-07 DIAGNOSIS — Z3A37 37 weeks gestation of pregnancy: Secondary | ICD-10-CM | POA: Insufficient documentation

## 2019-04-07 LAB — WET PREP, GENITAL
Clue Cells Wet Prep HPF POC: NONE SEEN
Sperm: NONE SEEN
Trich, Wet Prep: NONE SEEN
Yeast Wet Prep HPF POC: NONE SEEN

## 2019-04-07 LAB — POCT FERN TEST: POCT Fern Test: NEGATIVE

## 2019-04-07 NOTE — MAU Provider Note (Signed)
First Provider Initiated Contact with Patient 04/07/19 1534     S: Ms. Stacy Sellers is a 18 y.o. G2P0101 at [redacted]w[redacted]d  who presents to MAU today complaining of pink-tinged vaginal discharge last night x 1 episode. She denies ongoing LOF. She reports strong contractions and perineal pressure for about one hour earlier today. Her contractions and pressure resolved spontaneously without intervention. She denies contraction pain upon CNM introduction in MAU. She denies vaginal bleeding. She reports normal fetal movement.    O: BP 114/67 (BP Location: Left Arm)   Pulse 87   Temp 98.5 F (36.9 C) (Oral)   Resp 18   Ht 5\' 3"  (1.6 m)   Wt 83.6 kg   LMP 07/10/2018 (Approximate)   SpO2 100%   BMI 32.65 kg/m  GENERAL: Well-developed, well-nourished female in no acute distress.  HEAD: Normocephalic, atraumatic.  CHEST: Normal effort of breathing, regular heart rate ABDOMEN: Soft, nontender, gravid  Cervical exam:  Dilation: Closed Exam by:: Maryelizabeth Kaufmann, CNM   Fetal Monitoring: Baseline: 135 Variability: Moderate Accelerations: Positive 15 x 15 Decelerations: None Contractions: Occasional, not felt by patient  A: SIUP at [redacted]w[redacted]d  Negative pooling Negative gush with valsalva Negative fern Normotensive Category I tracing  P: Discharge home in stable condition with labor precautions  F/U: [facility] 04/14/19  Darlina Rumpf, CNM 04/07/2019 4:55 PM

## 2019-04-07 NOTE — MAU Note (Signed)
Last night was having some pinkish d/c.  About an hour ago, was having a lot of contractions and pressure, has stopped.. having a lot of back pain, feeling nauseous. Had some watery d/c about an hour ago.

## 2019-04-07 NOTE — Discharge Instructions (Signed)

## 2019-04-13 ENCOUNTER — Inpatient Hospital Stay (HOSPITAL_COMMUNITY)
Admission: AD | Admit: 2019-04-13 | Discharge: 2019-04-13 | Disposition: A | Discharge status: Home / Self Care | Payer: Medicaid Other | Attending: Obstetrics and Gynecology | Admitting: Obstetrics and Gynecology

## 2019-04-13 ENCOUNTER — Other Ambulatory Visit: Payer: Self-pay

## 2019-04-13 ENCOUNTER — Encounter (HOSPITAL_COMMUNITY): Payer: Self-pay

## 2019-04-13 DIAGNOSIS — O471 False labor at or after 37 completed weeks of gestation: Secondary | ICD-10-CM | POA: Insufficient documentation

## 2019-04-13 DIAGNOSIS — O479 False labor, unspecified: Secondary | ICD-10-CM

## 2019-04-13 DIAGNOSIS — A749 Chlamydial infection, unspecified: Secondary | ICD-10-CM

## 2019-04-13 DIAGNOSIS — Z3A38 38 weeks gestation of pregnancy: Secondary | ICD-10-CM | POA: Insufficient documentation

## 2019-04-13 DIAGNOSIS — Z3689 Encounter for other specified antenatal screening: Secondary | ICD-10-CM

## 2019-04-13 DIAGNOSIS — O98813 Other maternal infectious and parasitic diseases complicating pregnancy, third trimester: Secondary | ICD-10-CM

## 2019-04-13 NOTE — MAU Provider Note (Signed)
None    S: Ms. Stacy Sellers is a 18 y.o. G2P0101 at [redacted]w[redacted]d  who presents to MAU today complaining contractions q 1 minutes since last night. She denies vaginal bleeding. She denies LOF. She reports normal fetal movement.    O: BP 107/71   Pulse (!) 112   Temp 98.4 F (36.9 C) (Oral)   LMP 07/10/2018 (Approximate)  GENERAL: Well-developed, well-nourished female in no acute distress.  HEAD: Normocephalic, atraumatic.  CHEST: Normal effort of breathing, regular heart rate ABDOMEN: Soft, nontender, gravid  Cervical exam:  Dilation: 3 Effacement (%): 60 Cervical Position: Middle Station: -3 Presentation: Vertex Exam by:: Jeronimo Greaves, CNM   Fetal Monitoring: Baseline: 140 Variability: moderate Accelerations: Positive 15 x 15 Decelerations: None Contractions: Irregular q 2-5 min, palpate mild   A: SIUP at [redacted]w[redacted]d  False labor No cervical change in two hours of evaluation Following initial triage interview, patient denies pain throughout remainder of evaluation in MAU  P: Discharge home in stable condition with labor precautions   F/U: Patient has OB appt with [facility] tomorrow at 04/14/19 at [place], [place], [place] 04/13/2019 6:29 PM

## 2019-04-13 NOTE — MAU Note (Signed)
Pt is a G2P1 at 38.3 weeks c/o contractions 1 minute ap[art.  Pt is unable to tell me when they started but states "they got so bad I couldn't walk."  No LOF, no VB, positive FM.  No other OB or medical concerns.

## 2019-04-13 NOTE — Discharge Instructions (Signed)

## 2019-04-14 ENCOUNTER — Ambulatory Visit (INDEPENDENT_AMBULATORY_CARE_PROVIDER_SITE_OTHER): Payer: Medicaid Other | Admitting: Obstetrics and Gynecology

## 2019-04-14 ENCOUNTER — Encounter: Payer: Self-pay | Admitting: Obstetrics and Gynecology

## 2019-04-14 DIAGNOSIS — A749 Chlamydial infection, unspecified: Secondary | ICD-10-CM

## 2019-04-14 DIAGNOSIS — Z3480 Encounter for supervision of other normal pregnancy, unspecified trimester: Secondary | ICD-10-CM

## 2019-04-14 DIAGNOSIS — O98813 Other maternal infectious and parasitic diseases complicating pregnancy, third trimester: Secondary | ICD-10-CM

## 2019-04-14 DIAGNOSIS — Z3A38 38 weeks gestation of pregnancy: Secondary | ICD-10-CM

## 2019-04-14 NOTE — Progress Notes (Signed)
   [facility] VIRTUAL VIDEO VISIT ENCOUNTER NOTE  Provider location: [facility] [place]   I connected with Stacy Sellers on 04/14/19 at  2:00 PM EDT by WebEx Encounter at home and verified that I am speaking with the correct person using two identifiers.   I discussed the limitations, risks, security and privacy concerns of performing an evaluation and management service by telephone and the availability of in person appointments. I also discussed with the patient that there may be a patient responsible charge related to this service. The patient expressed understanding and agreed to proceed. Subjective:  Stacy Sellers is a 18 y.o. G2P0101 at [redacted]w[redacted]d being seen today for ongoing prenatal care.  She is currently monitored for the following issues for this low-risk pregnancy and has History of premature delivery; Chlamydia infection affecting pregnancy; Encounter for supervision of normal pregnancy, antepartum; and Late prenatal care complicating pregnancy in second trimester on their problem list.  Patient reports general discomforts of pregnancy.  Contractions: Irregular. Vag. Bleeding: Scant.  Movement: Present. Denies any leaking of fluid.   The following portions of the patient's history were reviewed and updated as appropriate: allergies, current medications, past family history, past medical history, past social history, past surgical history and problem list.   Objective:  There were no vitals filed for this visit.  Fetal Status:     Movement: Present     General:  Alert, oriented and cooperative. Patient is in no acute distress.  Respiratory: Normal respiratory effort, no problems with respiration noted  Mental Status: Normal mood and affect. Normal behavior. Normal judgment and thought content.  Rest of physical exam deferred due to type of encounter  Imaging: No results found.  Assessment and Plan:  Pregnancy: G2P0101 at [redacted]w[redacted]d 1. Supervision  of other normal pregnancy, antepartum Stable Labor precautions  2. Chlamydia infection affecting pregnancy in third trimester TOC negative  Term labor symptoms and general obstetric precautions including but not limited to vaginal bleeding, contractions, leaking of fluid and fetal movement were reviewed in detail with the patient. I discussed the assessment and treatment plan with the patient. The patient was provided an opportunity to ask questions and all were answered. The patient agreed with the plan and demonstrated an understanding of the instructions. The patient was advised to call back or seek an in-person office evaluation/go to MAU at [facility] for any urgent or concerning symptoms. Please refer to After Visit Summary for other counseling recommendations.   I provided 8 minutes of face-to-face time during this encounter.  No follow-ups on file.  No future appointments.  Chancy Milroy, MD [facility] [place], [place]

## 2019-04-14 NOTE — Progress Notes (Signed)
Pt c/o brown vaginal discharge pt went to MAU yesterday for contractions.

## 2019-04-21 ENCOUNTER — Ambulatory Visit (INDEPENDENT_AMBULATORY_CARE_PROVIDER_SITE_OTHER): Payer: Medicaid Other | Admitting: Family Medicine

## 2019-04-21 ENCOUNTER — Other Ambulatory Visit: Payer: Self-pay

## 2019-04-21 VITALS — BP 120/79 | HR 85 | Wt 187.0 lb

## 2019-04-21 DIAGNOSIS — Z3A39 39 weeks gestation of pregnancy: Secondary | ICD-10-CM

## 2019-04-21 DIAGNOSIS — Z3480 Encounter for supervision of other normal pregnancy, unspecified trimester: Secondary | ICD-10-CM

## 2019-04-21 DIAGNOSIS — Z3483 Encounter for supervision of other normal pregnancy, third trimester: Secondary | ICD-10-CM

## 2019-04-21 NOTE — Progress Notes (Signed)
    PRENATAL VISIT NOTE  Subjective:  Stacy Sellers is a 18 y.o. G2P0101 at [redacted]w[redacted]d being seen today for ongoing prenatal care.  She is currently monitored for the following issues for this low-risk pregnancy and has History of premature delivery; Chlamydia infection affecting pregnancy; Encounter for supervision of normal pregnancy, antepartum; and Late prenatal care complicating pregnancy in second trimester on their problem list.  Patient reports contractions since 1 week.  Contractions: Regular. Vag. Bleeding: None.  Movement: Present. Denies leaking of fluid.   The following portions of the patient's history were reviewed and updated as appropriate: allergies, current medications, past family history, past medical history, past social history, past surgical history and problem list.   Objective:   Vitals:   04/21/19 1045  BP: 120/79  Pulse: 85  Weight: 187 lb (84.8 kg)    Fetal Status: Fetal Heart Rate (bpm): 132 Fundal Height: 35 cm Movement: Present  Presentation: Vertex  General:  Alert, oriented and cooperative. Patient is in no acute distress.  Skin: Skin is warm and dry. No rash noted.   Cardiovascular: Normal heart rate noted  Respiratory: Normal respiratory effort, no problems with respiration noted  Abdomen: Soft, gravid, appropriate for gestational age.  Pain/Pressure: Present     Pelvic: Cervical exam performed Dilation: Fingertip Effacement (%): 30 Station: -3  Extremities: Normal range of motion.  Edema: Trace  Mental Status: Normal mood and affect. Normal behavior. Normal judgment and thought content.   Assessment and Plan:  Pregnancy: G2P0101 at [redacted]w[redacted]d 1. Supervision of other normal pregnancy, antepartum Continue routine prenatal care. IOL scheduled  Preterm labor symptoms and general obstetric precautions including but not limited to vaginal bleeding, contractions, leaking of fluid and fetal movement were reviewed in detail with the patient. Please refer to  After Visit Summary for other counseling recommendations.   Return in 1 week (on 04/28/2019).  Future Appointments  Date Time Provider Department Center  04/28/2019 10:15 AM Woodroe Mode, MD [facility] None    Donnamae Jude, MD

## 2019-04-21 NOTE — Patient Instructions (Signed)

## 2019-04-21 NOTE — Progress Notes (Signed)
Pt is here for ROB, [redacted]w[redacted]d. Pt reports she is having contractions every minute, pt requests cervix check today.

## 2019-04-24 ENCOUNTER — Telehealth (HOSPITAL_COMMUNITY): Payer: Self-pay

## 2019-04-25 ENCOUNTER — Other Ambulatory Visit: Payer: Self-pay

## 2019-04-25 ENCOUNTER — Encounter (HOSPITAL_COMMUNITY): Payer: Self-pay

## 2019-04-25 ENCOUNTER — Inpatient Hospital Stay (HOSPITAL_COMMUNITY)
Admission: AD | Admit: 2019-04-25 | Discharge: 2019-04-27 | DRG: 807 | Disposition: A | Discharge status: Home / Self Care | Payer: Medicaid Other | Attending: Family Medicine | Admitting: Family Medicine

## 2019-04-25 DIAGNOSIS — O43123 Velamentous insertion of umbilical cord, third trimester: Principal | ICD-10-CM | POA: Diagnosis present

## 2019-04-25 DIAGNOSIS — O26893 Other specified pregnancy related conditions, third trimester: Secondary | ICD-10-CM | POA: Diagnosis present

## 2019-04-25 DIAGNOSIS — O480 Post-term pregnancy: Secondary | ICD-10-CM | POA: Diagnosis not present

## 2019-04-25 DIAGNOSIS — Z1159 Encounter for screening for other viral diseases: Secondary | ICD-10-CM | POA: Diagnosis not present

## 2019-04-25 DIAGNOSIS — Z3A40 40 weeks gestation of pregnancy: Secondary | ICD-10-CM | POA: Diagnosis not present

## 2019-04-25 DIAGNOSIS — Z3480 Encounter for supervision of other normal pregnancy, unspecified trimester: Secondary | ICD-10-CM

## 2019-04-25 LAB — CBC
HCT: 40.5 % (ref 36.0–46.0)
Hemoglobin: 13.2 g/dL (ref 12.0–15.0)
MCH: 31.7 pg (ref 26.0–34.0)
MCHC: 32.6 g/dL (ref 30.0–36.0)
MCV: 97.4 fL (ref 80.0–100.0)
Platelets: 189 10*3/uL (ref 150–400)
RBC: 4.16 MIL/uL (ref 3.87–5.11)
RDW: 13.2 % (ref 11.5–15.5)
WBC: 4.9 10*3/uL (ref 4.0–10.5)
nRBC: 0 % (ref 0.0–0.2)

## 2019-04-25 LAB — TYPE AND SCREEN
ABO/RH(D): O POS
Antibody Screen: NEGATIVE

## 2019-04-25 LAB — SARS CORONAVIRUS 2 BY RT PCR (HOSPITAL ORDER, PERFORMED IN ~~LOC~~ HOSPITAL LAB): SARS Coronavirus 2: NEGATIVE

## 2019-04-25 MED ORDER — SOD CITRATE-CITRIC ACID 500-334 MG/5ML PO SOLN: 30.0000 mL | ORAL | Status: DC | PRN

## 2019-04-25 MED ORDER — FLEET ENEMA 7-19 GM/118ML RE ENEM: 1.0000 | ENEMA | RECTAL | Status: DC | PRN

## 2019-04-25 MED ORDER — OXYTOCIN BOLUS FROM INFUSION
500.0000 mL | Freq: Once | INTRAVENOUS | Status: DC
  Administered 2019-04-25: 11:00:00 500 mL via INTRAVENOUS

## 2019-04-25 MED ORDER — PRENATAL MULTIVITAMIN CH
1.0000 | ORAL_TABLET | Freq: Every day | ORAL | Status: DC
  Administered 2019-04-26 – 2019-04-27 (×2): 1 via ORAL
  Filled 2019-04-25 (×2): qty 1

## 2019-04-25 MED ORDER — ONDANSETRON HCL 4 MG/2ML IJ SOLN: 4.0000 mg | INTRAMUSCULAR | Status: DC | PRN

## 2019-04-25 MED ORDER — LIDOCAINE HCL (PF) 1 % IJ SOLN: 30.0000 mL | INTRAMUSCULAR | Status: DC | PRN

## 2019-04-25 MED ORDER — SIMETHICONE 80 MG PO CHEW: 80.0000 mg | CHEWABLE_TABLET | ORAL | Status: DC | PRN

## 2019-04-25 MED ORDER — OXYCODONE-ACETAMINOPHEN 5-325 MG PO TABS: 2.0000 | ORAL_TABLET | ORAL | Status: DC | PRN

## 2019-04-25 MED ORDER — DIBUCAINE (PERIANAL) 1 % EX OINT: 1.0000 "application " | TOPICAL_OINTMENT | CUTANEOUS | Status: DC | PRN

## 2019-04-25 MED ORDER — ONDANSETRON HCL 4 MG PO TABS: 4.0000 mg | ORAL_TABLET | ORAL | Status: DC | PRN

## 2019-04-25 MED ORDER — OXYTOCIN 40 UNITS IN NORMAL SALINE INFUSION - SIMPLE MED
2.5000 [IU]/h | INTRAVENOUS | Status: DC
  Administered 2019-04-25: 12:00:00 2.5 [IU]/h via INTRAVENOUS

## 2019-04-25 MED ORDER — COCONUT OIL OIL: 1.0000 "application " | TOPICAL_OIL | Status: DC | PRN

## 2019-04-25 MED ORDER — OXYTOCIN 40 UNITS IN NORMAL SALINE INFUSION - SIMPLE MED
INTRAVENOUS | Status: AC
  Filled 2019-04-25: qty 1000

## 2019-04-25 MED ORDER — ZOLPIDEM TARTRATE 5 MG PO TABS: 5.0000 mg | ORAL_TABLET | Freq: Every evening | ORAL | Status: DC | PRN

## 2019-04-25 MED ORDER — IBUPROFEN 600 MG PO TABS
600.0000 mg | ORAL_TABLET | Freq: Four times a day (QID) | ORAL | Status: DC
  Administered 2019-04-25 – 2019-04-27 (×8): 600 mg via ORAL
  Filled 2019-04-25 (×8): qty 1

## 2019-04-25 MED ORDER — LACTATED RINGERS IV SOLN: 500.0000 mL | INTRAVENOUS | Status: DC | PRN

## 2019-04-25 MED ORDER — ONDANSETRON HCL 4 MG/2ML IJ SOLN: 4.0000 mg | Freq: Four times a day (QID) | INTRAMUSCULAR | Status: DC | PRN

## 2019-04-25 MED ORDER — ACETAMINOPHEN 325 MG PO TABS
650.0000 mg | ORAL_TABLET | ORAL | Status: DC | PRN
  Administered 2019-04-26 – 2019-04-27 (×2): 650 mg via ORAL
  Filled 2019-04-25 (×2): qty 2

## 2019-04-25 MED ORDER — DIPHENHYDRAMINE HCL 25 MG PO CAPS: 25.0000 mg | ORAL_CAPSULE | Freq: Four times a day (QID) | ORAL | Status: DC | PRN

## 2019-04-25 MED ORDER — BENZOCAINE-MENTHOL 20-0.5 % EX AERO: 1.0000 "application " | INHALATION_SPRAY | CUTANEOUS | Status: DC | PRN

## 2019-04-25 MED ORDER — ACETAMINOPHEN 325 MG PO TABS: 650.0000 mg | ORAL_TABLET | ORAL | Status: DC | PRN

## 2019-04-25 MED ORDER — LACTATED RINGERS IV SOLN
INTRAVENOUS | Status: DC
  Administered 2019-04-25: 09:00:00 via INTRAVENOUS

## 2019-04-25 MED ORDER — TETANUS-DIPHTH-ACELL PERTUSSIS 5-2.5-18.5 LF-MCG/0.5 IM SUSP: 0.5000 mL | Freq: Once | INTRAMUSCULAR | Status: DC

## 2019-04-25 MED ORDER — WITCH HAZEL-GLYCERIN EX PADS: 1.0000 "application " | MEDICATED_PAD | CUTANEOUS | Status: DC | PRN

## 2019-04-25 MED ORDER — OXYCODONE-ACETAMINOPHEN 5-325 MG PO TABS: 1.0000 | ORAL_TABLET | ORAL | Status: DC | PRN

## 2019-04-25 MED ORDER — FENTANYL CITRATE (PF) 100 MCG/2ML IJ SOLN
100.0000 ug | INTRAMUSCULAR | Status: DC | PRN
  Administered 2019-04-25: 10:00:00 50 ug via INTRAVENOUS
  Filled 2019-04-25: qty 2

## 2019-04-25 MED ORDER — SENNOSIDES-DOCUSATE SODIUM 8.6-50 MG PO TABS
2.0000 | ORAL_TABLET | ORAL | Status: DC
  Administered 2019-04-25 – 2019-04-27 (×2): 2 via ORAL
  Filled 2019-04-25 (×2): qty 2

## 2019-04-25 NOTE — H&P (Signed)
OBSTETRIC ADMISSION HISTORY AND PHYSICAL  Stacy Sellers is a 18 y.o. female G76P0101 with IUP at [redacted]w[redacted]d by early u/s presenting for spontaneous onset of labor. She reports +FMs, No LOF, no VB, no blurry vision, headaches or peripheral edema, and RUQ pain.  She plans on bottle feeding. She request OCps for birth control. She received her prenatal care at [place]: By 6w u/s --->  Estimated Date of Delivery: 04/24/19  Nursing Staff Provider  Office Location  [place] Dating   Early [place] at 6 weeks  Language   English Anatomy US   Normal  Flu Vaccine   Declined Genetic Screen  NIPS:   AFP: too late    TDaP vaccine   Declined 02/05/2019 Hgb A1C or  GTT Third trimester: 123  Rhogam     LAB RESULTS   Feeding Plan  Bottle Blood Type O/Positive/-- (03/27 1146) O+  Contraception  OCP Antibody Negative (03/27 1146) Negative  Circumcision  Yes Rubella 1.75 (03/27 1146)Immune  Pediatrician   on [place] RPR Non Reactive (04/09 0937) Negtive  Support Person  Fabienne Bruns - Mother HBsAg Negative (03/27 1146) Negative  Prenatal Classes  Yes HIV Non Reactive (04/09 0937) NR  BTL Consent NA GBS Negative (06/02 0312)(For PCN allergy, check sensitivities)   VBAC Consent NA Pap  18 yo    Hgb Electro      CF     SMA     Waterbirth  [ ]  Class [ ]  Consent [ ]  CNM visit   Prenatal History/Complications:  Past Medical History: Past Medical History:  Diagnosis Date  . Medical history non-contributory     Past Surgical History: Past Surgical History:  Procedure Laterality Date  . NO PAST SURGERIES      Obstetrical History: OB History    Gravida  2   Para  1   Term      Preterm  1   AB      Living  1     SAB      TAB      Ectopic      Multiple  0   Live Births  1           Social History: Social History   Socioeconomic History  . Marital status: Single    Spouse name: Not on file  . Number of children: Not on file  . Years of education: Not on file  . Highest  education level: Not on file  Occupational History    Employer: [employer]    Comment: Catering  Social Needs  . Financial resource strain: Not hard at all  . Food insecurity    Worry: Patient refused    Inability: Patient refused  . Transportation needs    Medical: Patient refused    Non-medical: Patient refused  Tobacco Use  . Smoking status: Never Smoker  . Smokeless tobacco: Never Used  Substance and Sexual Activity  . Alcohol use: No  . Drug use: No  . Sexual activity: Yes  Lifestyle  . Physical activity    Days per week: Patient refused    Minutes per session: Patient refused  . Stress: Rather much  Relationships  . Social Herbalist on phone: Not on file    Gets together: Not on file    Attends religious service: Not on file    Active member of club or organization: Not on file    Attends meetings  of clubs or organizations: Not on file    Relationship status: Not on file  Other Topics Concern  . Not on file  Social History Narrative  . Not on file    Family History: Family History  Problem Relation Age of Onset  . Hypertension Maternal Grandmother     Allergies: No Known Allergies  Medications Prior to Admission  Medication Sig Dispense Refill Last Dose  . acetaminophen (TYLENOL 8 HOUR) 650 MG CR tablet Take 1 tablet (650 mg total) by mouth every 4 (four) hours as needed for pain. 300 tablet 0   . Prenatal Vit-Fe Fumarate-FA (PRENATAL COMPLETE) 14-0.4 MG TABS Take 1 tablet by mouth daily. 60 each 0      Review of Systems   All systems reviewed and negative except as stated in HPI  Blood pressure 137/84, pulse (!) 101, temperature 97.9 F (36.6 C), temperature source Oral, resp. rate 20, last menstrual period 07/10/2018, SpO2 100 %, unknown if currently breastfeeding. General appearance: alert, cooperative and mild distress Lungs: clear to auscultation bilaterally Heart: regular rate and rhythm Abdomen: soft, non-tender; bowel sounds  normal Pelvic: n/a Extremities: Homans sign is negative, no sign of DVT DTR's +2 Presentation: cephalic Fetal monitoringBaseline: 125 bpm, Variability: Good {> 6 bpm), Accelerations: Reactive and Decelerations: Absent Uterine activityFrequency: Every 3 minutes Dilation: 5 Effacement (%): 90 Station: -1 Exam by:: Agustin CreeNicole Druebbisch RN    Prenatal labs: ABO, Rh: O/Positive/-- (03/27 1146) Antibody: Negative (03/27 1146) Rubella: 1.75 (03/27 1146) RPR: Non Reactive (04/09 0937)  HBsAg: Negative (03/27 1146)  HIV: Non Reactive (04/09 0937)  GBS: Negative (06/02 0312)   Prenatal Transfer Tool  Maternal Diabetes: No Genetic Screening: Normal Maternal Ultrasounds/Referrals: Normal Fetal Ultrasounds or other Referrals:  None Maternal Substance Abuse:  No Significant Maternal Medications:  None Significant Maternal Lab Results: Group B Strep negative  No results found for this or any previous visit (from the past 24 hour(s)).  Patient Active Problem List   Diagnosis Date Noted  . Normal labor 04/25/2019  . Encounter for supervision of normal pregnancy, antepartum 01/23/2019  . Late prenatal care complicating pregnancy in second trimester 01/23/2019  . Chlamydia infection affecting pregnancy 01/06/2019  . History of premature delivery 12/30/2018    Assessment/Plan:  Stacy Sellers is a 18 y.o. G2P0101 at 4441w1d here for spontaneous onset of labor  #Labor: expectant management #Pain: Per patient request #FWB: Cat 1 #ID:  GBS [id number] #MOF: Bottle #MOC: OCPs #Circ:  yes  Rolm BookbinderCaroline M , CNM  04/25/2019, 9:10 AM

## 2019-04-25 NOTE — MAU Note (Signed)
Pt refused covid testing.  

## 2019-04-25 NOTE — MAU Note (Signed)
Stacy Sellers is a 18 y.o. at [redacted]w[redacted]d here in MAU reporting: contractions and bloody mucus. No LOF.  Onset of complaint: 0750  Pain score: 10/10  Vitals:   04/25/19 0844  BP: 137/84  Pulse: (!) 101  Resp: 20  Temp: 97.9 F (36.6 C)  SpO2: 100%      Lab orders placed from triage: none

## 2019-04-25 NOTE — Discharge Summary (Signed)
Postpartum Discharge Summary     Patient Name: Stacy Sellers DOB: 28-Feb-2001 MRN: [id number]  Date of admission: 04/25/2019 Delivering Provider: Rolm BookbinderNEILL, CAROLINE M   Date of discharge: 04/27/2019  Admitting diagnosis: CTX Intrauterine pregnancy: 525w1d     Secondary diagnosis:  Active Problems:   Normal labor  Additional [facility]     Discharge diagnosis: Term Pregnancy Delivered                                                                                                Post partum procedures:n/a  Augmentation: AROM  Complications: None  Hospital course:  Onset of Labor With Vaginal Delivery     18 y.o. yo G2P0101 at 3925w1d was admitted in Active Labor on 04/25/2019. Patient had an uncomplicated labor course as follows:  Membrane Rupture Time/Date: 10:35 AM ,04/25/2019   Intrapartum Procedures: Episiotomy: None [1]                                         Lacerations:  None [1]  Patient had a delivery of a Viable infant. 04/25/2019  Information for the patient's newborn:  Kathlyn SacramentoHenry, Boy Lesbia [[id number]][[id number]]  Delivery Method: Vag-Spont     Pateint had an uncomplicated postpartum course.  She is ambulating, tolerating a regular diet, passing flatus, and urinating well. Patient is discharged home in stable condition on 04/27/19.   Magnesium Sulfate recieved: No BMZ received: No  Physical exam  Vitals:   04/26/19 0640 04/26/19 2110 04/27/19 0629 04/27/19 0959  BP: 113/78 102/69 139/90 109/77  Pulse: 93 93 63 72  Resp: 16 16    Temp: 98.3 F (36.8 C) 98.2 F (36.8 C) 98.3 F (36.8 C)   TempSrc: Oral Oral Oral   SpO2:  100% 100%    General: alert, cooperative and no distress Lochia: appropriate Uterine Fundus: firm Incision: N/A DVT Evaluation: No evidence of DVT seen on physical exam. Labs: Lab Results  Component Value Date   WBC 7.3 04/26/2019   HGB 11.3 (L) 04/26/2019   HCT 33.8 (L) 04/26/2019   MCV 97.4 04/26/2019   PLT 164 04/26/2019   CMP Latest Ref  Rng & Units 03/10/2018  Glucose 65 - 99 mg/dL 96  BUN 6 - 20 mg/dL 7  Creatinine 7.250.44 - 3.661.00 mg/dL 4.400.78  Sodium 347135 - 425145 mmol/L 139  Potassium 3.5 - 5.1 mmol/L 3.7  Chloride 101 - 111 mmol/L 105  CO2 22 - 32 mmol/L 27  Calcium 8.9 - 10.3 mg/dL 9.6  Total Protein 6.5 - 8.1 g/dL 7.5  Total Bilirubin 0.3 - 1.2 mg/dL 0.4  Alkaline Phos 38 - 126 U/L 88  AST 15 - 41 U/L 16  ALT 14 - 54 U/L 12(L)    Discharge instruction: per After Visit Summary and "Baby and Me Booklet".  After visit meds:  Allergies as of 04/27/2019   No Known Allergies     Medication List    TAKE these medications   acetaminophen 650 MG CR  tablet Commonly known as: Tylenol 8 Hour Take 1 tablet (650 mg total) by mouth every 4 (four) hours as needed for pain.   docusate sodium 100 MG capsule Commonly known as: COLACE Take 1 capsule (100 mg total) by mouth 2 (two) times daily for 15 days.   ibuprofen 600 MG tablet Commonly known as: ADVIL Take 1 tablet (600 mg total) by mouth every 6 (six) hours.   Prenatal Complete 14-0.4 MG Tabs Take 1 tablet by mouth daily.       Diet: routine diet  Activity: Advance as tolerated. Pelvic rest for 6 weeks.   Outpatient follow up:4 weeks Follow up Appt: Future Appointments  Date Time Provider [place]  05/27/2019  2:30 PM Lajean Manes, CNM [facility] None   Follow up Visit: Please schedule this patient for Postpartum visit in: 4 weeks with the following provider: Any provider For C/S patients schedule nurse incision check in weeks 2 weeks: no Low risk pregnancy complicated by: n/a Delivery mode:  SVD Anticipated Birth Control:  OCPs PP Procedures needed: n/a  Schedule Integrated [place] visit: no   Newborn Data: Live born female  Birth Weight:   APGAR: ,   Newborn Delivery   Birth date/time: 04/25/2019 11:06:00 Delivery type: Vaginal, Spontaneous      Baby Feeding: Breast Disposition:home with mother    Jorje Guild, NP

## 2019-04-25 NOTE — Progress Notes (Signed)
After delivery, patient discussed COVID testing with mother. Patient agreeable to be swabbed for COVID-19.  Wende Mott, CNM 04/25/19 11:33 AM

## 2019-04-25 NOTE — Progress Notes (Signed)
Patient is refusing COVID testing. Counseled patient on routine COVID-19 testing for all admission to inpatient units whether direct admit or from MAU/ED. Reviewed reasons for testing including identifying cases and protecting patients/staff from possible infection. Reassured patient that separation from newborn is not required for COVID+ tests in asymptomatic patients and that the plan of care will be created with Peds through shared decision-making. One support person is allowed for all admitted patients regardless of COVID test results.   Patient verbalized understanding and still refusing. Patient informed of policy to assume positive if not tested. Patient's support person unable to leave entire length of stay and infant to stay 60ft from patient unless being fed. Patient and mother verbalized understanding.  Wende Mott, CNM 04/25/19 10:10 AM

## 2019-04-25 NOTE — Plan of Care (Signed)

## 2019-04-26 LAB — CBC
HCT: 33.8 % — ABNORMAL LOW (ref 36.0–46.0)
Hemoglobin: 11.3 g/dL — ABNORMAL LOW (ref 12.0–15.0)
MCH: 32.6 pg (ref 26.0–34.0)
MCHC: 33.4 g/dL (ref 30.0–36.0)
MCV: 97.4 fL (ref 80.0–100.0)
Platelets: 164 10*3/uL (ref 150–400)
RBC: 3.47 MIL/uL — ABNORMAL LOW (ref 3.87–5.11)
RDW: 13.3 % (ref 11.5–15.5)
WBC: 7.3 10*3/uL (ref 4.0–10.5)
nRBC: 0 % (ref 0.0–0.2)

## 2019-04-26 NOTE — Progress Notes (Signed)
Postpartum Day 1  Subjective Up ad lib, voiding, tolerating PO, and + flatus. Denies dizziness, lightheadedness, tachycardia. Appropriate Lochia. Pain well controlled.  Objective BP 119/72   Pulse 96   Temp 98.5 F (36.9 C) (Oral)   Resp 16   LMP 07/10/2018 (Approximate)   SpO2 100%   Breastfeeding Unknown  Intake/Output      06/27 0701 - 06/28 0700   Blood 106   Total Output 106   Net -106         Physical Exam:  General: alert, cooperative, appears stated age, fatigued, and no distress Lochia: appropriate Uterine Fundus: firm DVT Evaluation: No evidence of DVT seen on physical exam, extremities warm and well perfused.   Recent Labs    04/25/19 0909 04/26/19 0451  HGB 13.2 11.3*  HCT 40.5 33.8*    Assessment & Plan Postpartum Day # 1 . Plan for d/c tomorrow  . Contraception: POPs . Circumcision: yes, outpatient    LOS: 1 day   Stacy Sellers 04/26/2019, 6:12 AM

## 2019-04-27 MED ORDER — IBUPROFEN 600 MG PO TABS: 600.0000 mg | ORAL_TABLET | Freq: Four times a day (QID) | ORAL | 0 refills | Status: DC

## 2019-04-27 MED ORDER — DOCUSATE SODIUM 100 MG PO CAPS: 100.0000 mg | ORAL_CAPSULE | Freq: Two times a day (BID) | ORAL | 0 refills | Status: AC

## 2019-04-27 NOTE — Discharge Instructions (Signed)
Vaginal Delivery, Care After °Refer to this sheet in the next few weeks. These instructions provide you with information about caring for yourself after vaginal delivery. Your health care provider may also give you more specific instructions. Your treatment has been planned according to current medical practices, but problems sometimes occur. Call your health care provider if you have any problems or questions. °What can I expect after the procedure? °After vaginal delivery, it is common to have: °· Some bleeding from your vagina. °· Soreness in your abdomen, your vagina, and the area of skin between your vaginal opening and your anus (perineum). °· Pelvic cramps. °· Fatigue. °Follow these instructions at home: °Medicines °· Take over-the-counter and prescription medicines only as told by your health care provider. °· If you were prescribed an antibiotic medicine, take it as told by your health care provider. Do not stop taking the antibiotic until it is finished. °Driving ° °· Do not drive or operate heavy machinery while taking prescription pain medicine. °· Do not drive for 24 hours if you received a sedative. °Lifestyle °· Do not drink alcohol. This is especially important if you are breastfeeding or taking medicine to relieve pain. °· Do not use tobacco products, including cigarettes, chewing tobacco, or e-cigarettes. If you need help quitting, ask your health care provider. °Eating and drinking °· Drink at least 8 eight-ounce glasses of water every day unless you are told not to by your health care provider. If you choose to breastfeed your baby, you may need to drink more water than this. °· Eat high-fiber foods every day. These foods may help prevent or relieve constipation. High-fiber foods include: °? Whole grain cereals and breads. °? Brown rice. °? Beans. °? Fresh fruits and vegetables. °Activity °· Return to your normal activities as told by your health care provider. Ask your health care provider what  activities are safe for you. °· Rest as much as possible. Try to rest or take a nap when your baby is sleeping. °· Do not lift anything that is heavier than your baby or 10 lb (4.5 kg) until your health care provider says that it is safe. °· Talk with your health care provider about when you can engage in sexual activity. This may depend on your: °? Risk of infection. °? Rate of healing. °? Comfort and desire to engage in sexual activity. °Vaginal Care °· If you have an episiotomy or a vaginal tear, check the area every day for signs of infection. Check for: °? More redness, swelling, or pain. °? More fluid or blood. °? Warmth. °? Pus or a bad smell. °· Do not use tampons or douches until your health care provider says this is safe. °· Watch for any blood clots that may pass from your vagina. These may look like clumps of dark red, brown, or black discharge. °General instructions °· Keep your perineum clean and dry as told by your health care provider. °· Wear loose, comfortable clothing. °· Wipe from front to back when you use the toilet. °· Ask your health care provider if you can shower or take a bath. If you had an episiotomy or a perineal tear during labor and delivery, your health care provider may tell you not to take baths for a certain length of time. °· Wear a bra that supports your breasts and fits you well. °· If possible, have someone help you with household activities and help care for your baby for at least a few days after you   leave the hospital. °· Keep all follow-up visits for you and your baby as told by your health care provider. This is important. °Contact a health care provider if: °· You have: °? Vaginal discharge that has a bad smell. °? Difficulty urinating. °? Pain when urinating. °? A sudden increase or decrease in the frequency of your bowel movements. °? More redness, swelling, or pain around your episiotomy or vaginal tear. °? More fluid or blood coming from your episiotomy or vaginal  tear. °? Pus or a bad smell coming from your episiotomy or vaginal tear. °? A fever. °? A rash. °? Little or no interest in activities you used to enjoy. °? Questions about caring for yourself or your baby. °· Your episiotomy or vaginal tear feels warm to the touch. °· Your episiotomy or vaginal tear is separating or does not appear to be healing. °· Your breasts are painful, hard, or turn red. °· You feel unusually sad or worried. °· You feel nauseous or you vomit. °· You pass large blood clots from your vagina. If you pass a blood clot from your vagina, save it to show to your health care provider. Do not flush blood clots down the toilet without having your health care provider look at them. °· You urinate more than usual. °· You are dizzy or light-headed. °· You have not breastfed at all and you have not had a menstrual period for 12 weeks after delivery. °· You have stopped breastfeeding and you have not had a menstrual period for 12 weeks after you stopped breastfeeding. °Get help right away if: °· You have: °? Pain that does not go away or does not get better with medicine. °? Chest pain. °? Difficulty breathing. °? Blurred vision or spots in your vision. °? Thoughts about hurting yourself or your baby. °· You develop pain in your abdomen or in one of your legs. °· You develop a severe headache. °· You faint. °· You bleed from your vagina so much that you fill two sanitary pads in one hour. °This information is not intended to replace advice given to you by your health care provider. Make sure you discuss any questions you have with your health care provider. °Document Released: 10/12/2000 Document Revised: 03/28/2016 Document Reviewed: 10/30/2015 °Elsevier Interactive Patient Education © 2019 [employer] ° °

## 2019-04-28 ENCOUNTER — Encounter: Payer: Medicaid Other | Admitting: Obstetrics & Gynecology

## 2019-04-29 ENCOUNTER — Other Ambulatory Visit (HOSPITAL_COMMUNITY): Payer: Medicaid Other

## 2019-05-01 ENCOUNTER — Inpatient Hospital Stay (HOSPITAL_COMMUNITY): Payer: Medicaid Other

## 2019-05-04 LAB — RPR: RPR Ser Ql: NONREACTIVE

## 2019-05-20 ENCOUNTER — Telehealth: Payer: Self-pay

## 2019-05-20 NOTE — Telephone Encounter (Signed)
Contacted pt after home nurse called, Flavia Shipper score= 9. Nurse referred pt to [facility] for follow up. Pt did not answer, left vm. Nurse confirmed no suicidal thoughts, next appt 05-27-19

## 2019-05-27 ENCOUNTER — Ambulatory Visit: Payer: Medicaid Other | Admitting: Certified Nurse Midwife

## 2019-06-09 ENCOUNTER — Other Ambulatory Visit: Payer: Self-pay

## 2019-06-09 ENCOUNTER — Encounter: Payer: Self-pay | Admitting: Nurse Practitioner

## 2019-06-09 ENCOUNTER — Ambulatory Visit (INDEPENDENT_AMBULATORY_CARE_PROVIDER_SITE_OTHER): Payer: Medicaid Other | Admitting: Nurse Practitioner

## 2019-06-09 VITALS — BP 122/76 | HR 93 | Wt 173.0 lb

## 2019-06-09 DIAGNOSIS — M545 Low back pain, unspecified: Secondary | ICD-10-CM

## 2019-06-09 DIAGNOSIS — Z3202 Encounter for pregnancy test, result negative: Secondary | ICD-10-CM | POA: Diagnosis not present

## 2019-06-09 DIAGNOSIS — Z1389 Encounter for screening for other disorder: Secondary | ICD-10-CM

## 2019-06-09 DIAGNOSIS — F53 Postpartum depression: Secondary | ICD-10-CM

## 2019-06-09 DIAGNOSIS — Z30011 Encounter for initial prescription of contraceptive pills: Secondary | ICD-10-CM | POA: Insufficient documentation

## 2019-06-09 LAB — POCT URINE PREGNANCY: Preg Test, Ur: NEGATIVE

## 2019-06-09 MED ORDER — IBUPROFEN 600 MG PO TABS: 600.0000 mg | ORAL_TABLET | Freq: Four times a day (QID) | ORAL | 1 refills | Status: DC | PRN

## 2019-06-09 MED ORDER — LEVONORGESTREL-ETHINYL ESTRAD 0.15-30 MG-MCG PO TABS: 1.0000 | ORAL_TABLET | Freq: Every day | ORAL | 2 refills | Status: DC

## 2019-06-09 MED ORDER — VITAFOL ULTRA 29-0.6-0.4-200 MG PO CAPS: 1.0000 | ORAL_CAPSULE | Freq: Every day | ORAL | 11 refills | Status: AC

## 2019-06-09 NOTE — Progress Notes (Signed)
Post Partum Exam  Stacy Sellers is a 18 y.o. G17P1102 female who presents for a postpartum visit. She is 4 weeks postpartum following a spontaneous vaginal delivery. I have fully reviewed the prenatal and intrapartum course. The delivery was at [redacted]w[redacted]d gestational weeks.  Anesthesia: none. Postpartum course has been unremarkable. Baby's course has been unremarkable. Baby is feeding by Enfamil. Bleeding no bleeding. Bowel function is normal. Bladder function is normal. Patient is not sexually active. Contraception method is none. Postpartum depression screening:EPDS = 12 Pt requests BCP Needs refill on IBuprofen and PNV's.   Complains of left wrist pain and "popping" in and out of place where the IV was placed while she was in labor.  States she has had depression throughout the pregnancy but never was referred for any evaluation.  Symptoms are persisting and have not resolved.  States her mom recommends her to be seen as she is not getting back to normal.  Is not having crying spells but she feels like she is depressed.  Sleeps well, is not drinking many fluids and has no appetite sometimes.  Requests some type of help with depression and is not sure if she needs medication.  She has low back pain that has persisted and she has taken ibuprofen for that and it has helped.  Requests a refill.  The following portions of the patient's history were reviewed and updated as appropriate: allergies, current medications, past family history, past medical history, past social history, past surgical history and problem list. Last pap smear is not indicated - age 20.    Review of Systems Pertinent items noted in HPI and remainder of comprehensive ROS otherwise negative.    Objective:  unknown if currently breastfeeding.  General:  alert, cooperative and no distress - not tears but reports depression   Breasts:  not examined  Lungs: clear to auscultation bilaterally  Heart:  regular rate and rhythm, S1, S2  normal, no murmur, click, rub or gallop  Abdomen: not examined   Vulva:  not evaluated  Vagina: not evaluated  Cervix:  not evaluated  Corpus: not examined  Adnexa:  not evaluated  Rectal Exam: Not performed.        Assessment:    Stable postpartum exam. Having some symptoms of depression which need further evaluation.  Starting contraceptive pills.   Pap smear not done at today's visit.   Plan:   1. Contraception: OCP (estrogen/progesterone)  Prescription sent to her pharmacy.  Start on Sunday.  Return in 3 months for BP check and pill refill. 2. Low back pain - prescribed ibuprofen again for client.  Has used ibuprofen prescribed at discharge and it has helped her back. 3. Left wrist pain - advised to try ice on wrist at night for 15 minutes.  Place cloth between ice pack and skin.  Advised this is probably persistent due to carrying baby in car seat.   3. Follow up in: 3 months or as needed.  Sent prenatal vitamin prescription to her pharmacy at client's request. 5.  Message sent to [facility] provider for initial evaluation on status of depression and recommendations.  Advised client to drink 4 bottles of water daily (currently drinking one) and to eat healthy meals and snacks rather than skipping meals.  Advised to spend at least 10 minutes outside daily.

## 2019-06-09 NOTE — Patient Instructions (Signed)
Oral Contraception Use Oral contraceptive pills (OCPs) are medicines that you take to prevent pregnancy. OCPs work by:  Preventing the ovaries from releasing eggs.  Thickening mucus in the lower part of the uterus (cervix), which prevents sperm from entering the uterus.  Thinning the lining of the uterus (endometrium), which prevents a fertilized egg from attaching to the endometrium. OCPs are highly effective when taken exactly as prescribed. However, OCPs do not prevent sexually transmitted infections (STIs). Safe sex practices, such as using condoms while on an OCP, can help prevent STIs. Before taking OCPs, you may have a physical exam, blood test, and Pap test. A Pap test involves taking a sample of cells from your cervix to check for cancer. Discuss with your health care provider the possible side effects of the OCP you may be prescribed. When you start an OCP, be aware that it can take 2-3 months for your body to adjust to changes in hormone levels. How to take oral contraceptive pills Follow instructions from your health care provider about how to start taking your first cycle of OCPs. Your health care provider may recommend that you:  Start the pill on day 1 of your menstrual period. If you start at this time, you will not need any backup form of birth control (contraception), such as condoms.  Start the pill on the first Sunday after your menstrual period or on the day you get your prescription. In these cases, you will need to use backup contraception for the first week.  Start the pill at any time of your cycle. ? If you take the pill within 5 days of the start of your period, you will not need a backup form of contraception. ? If you start at any other time of your menstrual cycle, you will need to use another form of contraception for 7 days. If your OCP is the type called a minipill, it will protect you from pregnancy after taking it for 2 days (48 hours), and you can stop using  backup contraception after that time. After you have started taking OCPs:  If you forget to take 1 pill, take it as soon as you remember. Take the next pill at the regular time.  If you miss 2 or more pills, call your health care provider. Different pills have different instructions for missed doses. Use backup birth control until your next menstrual period starts.  If you use a 28-day pack that contains inactive pills and you miss 1 of the last 7 pills (pills with no hormones), throw away the rest of the non-hormone pills and start a new pill pack. No matter which day you start the OCP, you will always start a new pack on that same day of the week. Have an extra pack of OCPs and a backup contraceptive method available in case you miss some pills or lose your OCP pack. Follow these instructions at home:  Do not use any products that contain nicotine or tobacco, such as cigarettes and e-cigarettes. If you need help quitting, ask your health care provider.  Always use a condom to protect against STIs. OCPs do not protect against STIs.  Use a calendar to mark the days of your menstrual period.  Read the information and directions that came with your OCP. Talk to your health care provider if you have questions. Contact a health care provider if:  You develop nausea and vomiting.  You have abnormal vaginal discharge or bleeding.  You develop a rash.    You miss your menstrual period. Depending on the type of OCP you are taking, this may be a sign of pregnancy. Ask your health care provider for more information.  You are losing your hair.  You need treatment for mood swings or depression.  You get dizzy when taking the OCP.  You develop acne after taking the OCP.  You become pregnant or think you may be pregnant.  You have diarrhea, constipation, and abdominal pain or cramps.  You miss 2 or more pills. Get help right away if:  You develop chest pain.  You develop shortness of  breath.  You have an uncontrolled or severe headache.  You develop numbness or slurred speech.  You develop visual or speech problems.  You develop pain, redness, and swelling in your legs.  You develop weakness or numbness in your arms or legs. Summary  Oral contraceptive pills (OCPs) are medicines that you take to prevent pregnancy.  OCPs do not prevent sexually transmitted infections (STIs). Always use a condom to protect against STIs.  When you start an OCP, be aware that it can take 2-3 months for your body to adjust to changes in hormone levels.  Read all the information and directions that come with your OCP. This information is not intended to replace advice given to you by your health care provider. Make sure you discuss any questions you have with your health care provider. Document Released: 10/04/2011 Document Revised: 02/06/2019 Document Reviewed: 11/26/2016 Elsevier Patient Education  2020 [place] Depression When a woman feels excessive sadness, anger, or anxiety during pregnancy or during the first 12 months after she gives birth, she has a condition called perinatal depression. Depression can interfere with work, school, relationships, and other everyday activities. If it is not managed properly, it can also cause problems in the mother and her baby. Sometimes, perinatal depression is left untreated because symptoms are thought to be normal mood swings during and right after pregnancy. If you have symptoms of depression, it is important to talk with your health care provider. What are the causes? The exact cause of this condition is not known. Hormonal changes during and after pregnancy may play a role in causing perinatal depression. What increases the risk? You are more likely to develop this condition if:  You have a personal or family history of depression, anxiety, or mood disorders.  You experience a stressful life event during pregnancy, such  as the death of a loved one.  You have a lot of regular life stress.  You do not have support from family members or loved ones, or you are in an abusive relationship. What are the signs or symptoms? Symptoms of this condition include:  Feeling sad or hopeless.  Feelings of guilt.  Feeling irritable or overwhelmed.  Changes in your appetite.  Lack of energy or motivation.  Sleep problems.  Difficulty concentrating or completing tasks.  Loss of interest in hobbies or relationships.  Headaches or stomach problems that do not go away. How is this diagnosed? This condition is diagnosed based on a physical exam and mental evaluation. In some cases, your health care provider may use a depression screening tool. These tools include a list of questions that can help a health care provider diagnose depression. Your health care provider may refer you to a mental health expert who specializes in depression. How is this treated? This condition may be treated with:  Medicines. Your health care provider will only give you medicines that  have been proven safe for pregnancy and breastfeeding.  Talk therapy with a mental health professional to help change your patterns of thinking (cognitive behavioral therapy).  Support groups.  Brain stimulation or light therapies.  Stress reduction therapies, such as mindfulness. Follow these instructions at home: Lifestyle  Do not use any products that contain nicotine or tobacco, such as cigarettes and e-cigarettes. If you need help quitting, ask your health care provider.  Do not use alcohol when you are pregnant. After your baby is born, limit alcohol intake to no more than 1 drink a day. One drink equals 12 oz of beer, 5 oz of wine, or 1 oz of hard liquor.  Consider joining a support group for new mothers. Ask your health care provider for recommendations.  Take good care of yourself. Make sure you: ? Get plenty of sleep. If you are having  trouble sleeping, talk with your health care provider. ? Eat a healthy diet. This includes plenty of fruits and vegetables, whole grains, and lean proteins. ? Exercise regularly, as told by your health care provider. Ask your health care provider what exercises are safe for you. General instructions  Take over-the-counter and prescription medicines only as told by your health care provider.  Talk with your partner or family members about your feelings during pregnancy. Share any concerns or anxieties that you may have.  Ask for help with tasks or chores when you need it. Ask friends and family members to provide meals, watch your children, or help with cleaning.  Keep all follow-up visits as told by your health care provider. This is important. Contact a health care provider if:  You (or people close to you) notice that you have any symptoms of depression.  You have depression and your symptoms get worse.  You experience side effects from medicines, such as nausea or sleep problems. Get help right away if:  You feel like hurting yourself, your baby, or someone else. If you ever feel like you may hurt yourself or others, or have thoughts about taking your own life, get help right away. You can go to your nearest emergency department or call:  Your local emergency services (911 in the [place]  A suicide crisis helpline, such as the National Suicide Prevention Lifeline at 615-856-71401-617-047-5350. This is open 24 hours a day. Summary  Perinatal depression is when a woman feels excessive sadness, anger, or anxiety during pregnancy or during the first 12 months after she gives birth.  If perinatal depression is not treated, it can lead to health problems for the mother and her baby.  This condition is treated with medicines, talk therapy, stress reduction therapies, or a combination of two or more treatments.  Talk with your partner or family members about your feelings. Do not be afraid to ask  for help. This information is not intended to replace advice given to you by your health care provider. Make sure you discuss any questions you have with your health care provider. Document Released: 12/12/2016 Document Revised: 10/18/2017 Document Reviewed: 12/12/2016 Elsevier Patient Education  2020 [employer]

## 2019-06-09 NOTE — Addendum Note (Signed)
Addended by: Virginia Rochester on: 06/09/2019 04:52 PM   Modules accepted: Orders

## 2019-06-18 IMAGING — US US MFM OB DETAIL +14 WK
1 series · 13 of 28 positions shown · non-contrast
Comparison: none

[Series 1: us mfm ob detail +14 wk · 13 of 68 slices shown]
[im 3/68]
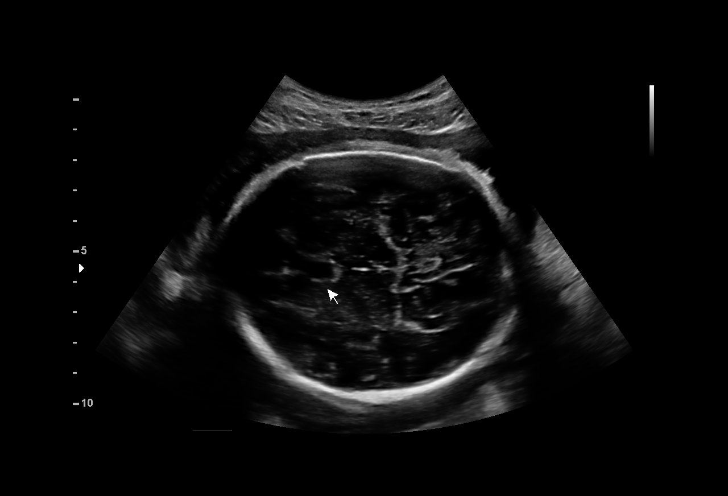
[im 8/68]
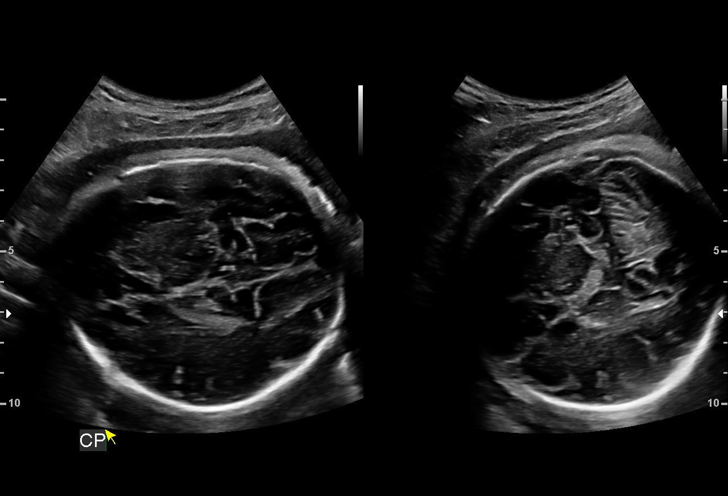
[im 13/68]
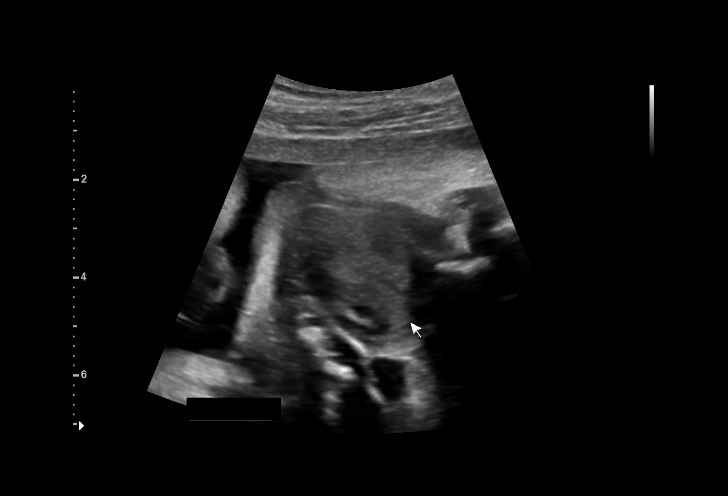
[im 18/68]
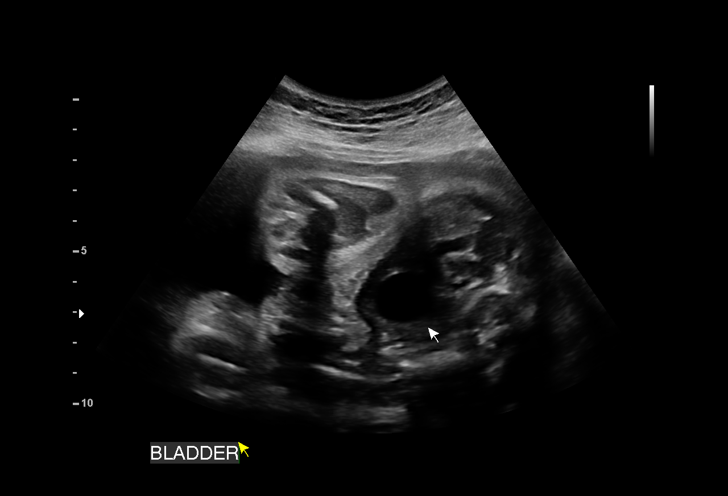
[im 23/68]
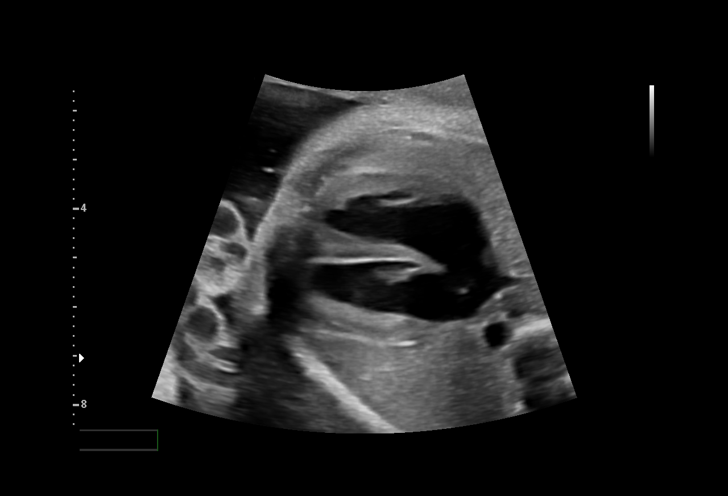
[im 28/68]
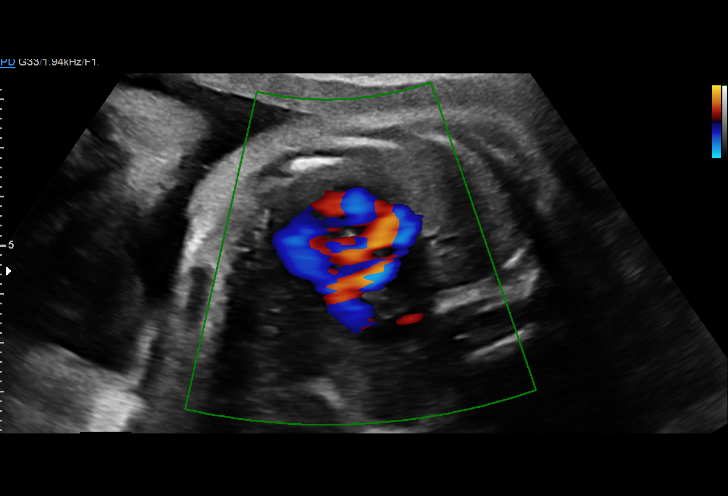
[im 35/68]
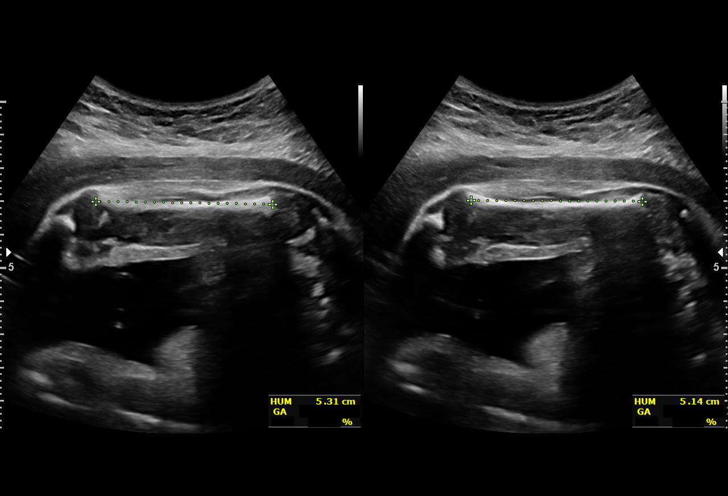
[im 40/68]
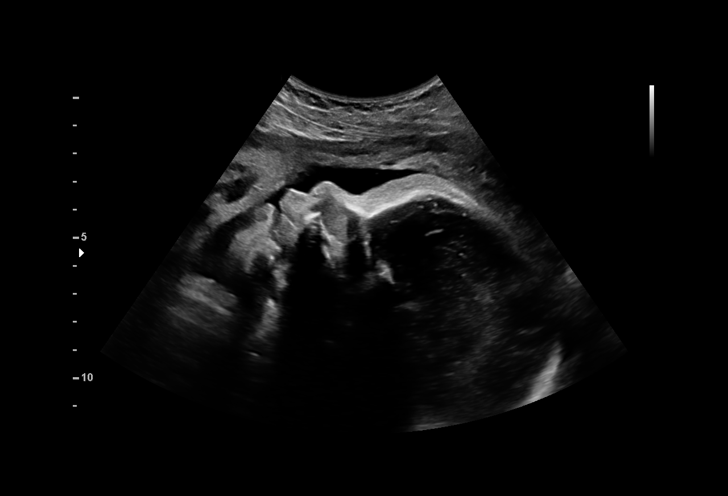
[im 45/68]
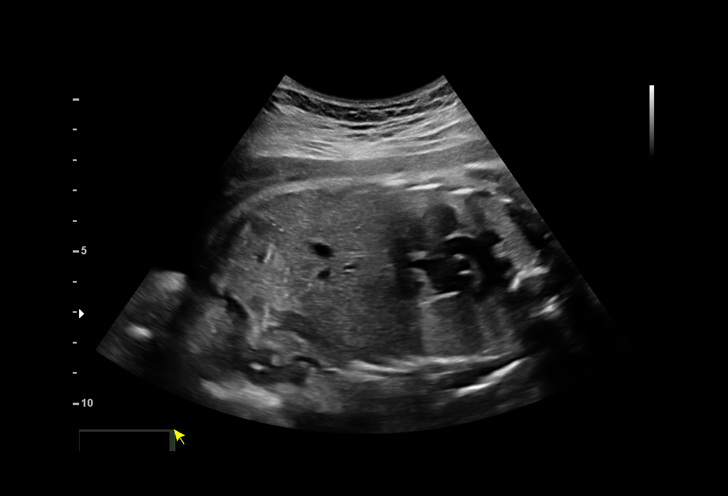
[im 50/68]
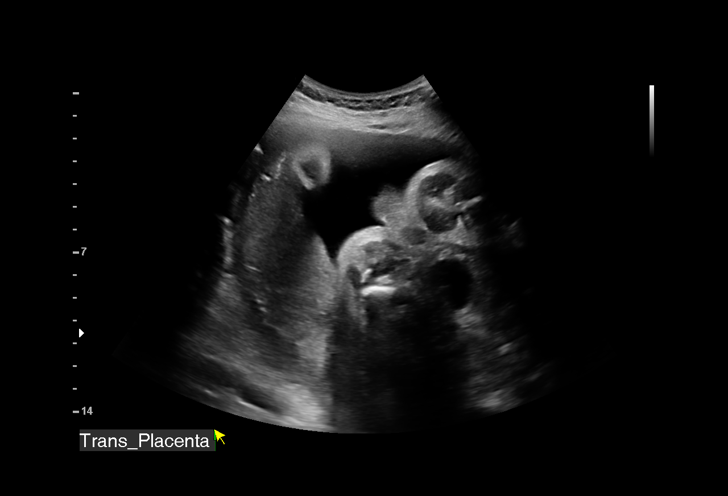
[im 55/68]
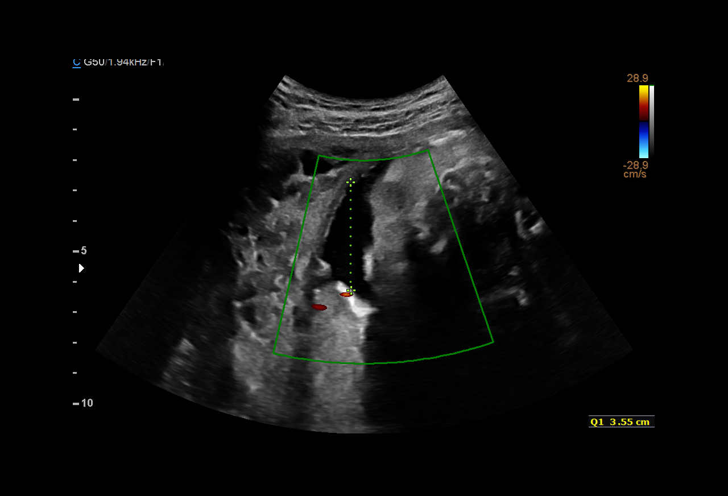
[im 60/68]
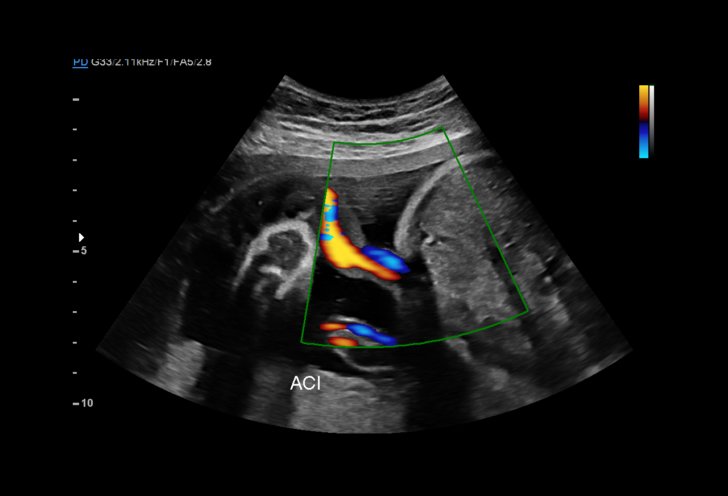
[im 65/68]
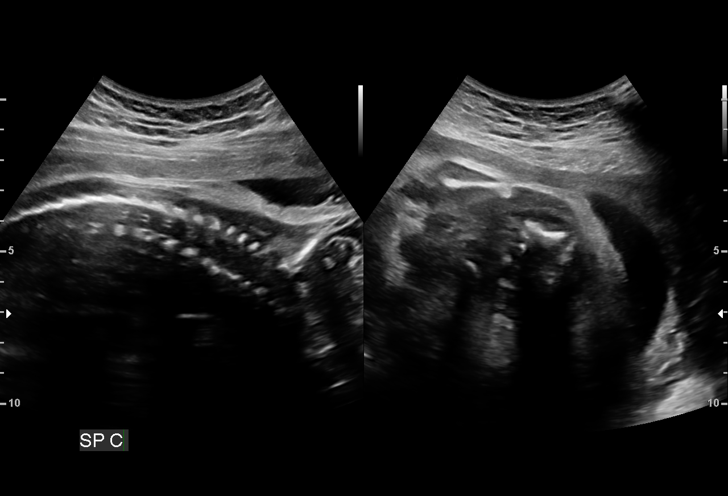

[13 of 28 positions shown; findings below may reference images not displayed]

----------------------------------------------------------------------

 ----------------------------------------------------------------------
Indications

  31 weeks gestation of pregnancy
  Encounter for antenatal screening for
  malformations (no testing in chart)
  Late to prenatal care, third trimester
  Insufficient Prenatal Care
  Poor obstetric history: Previous preterm
  delivery, antepartum (65w3d)
 ----------------------------------------------------------------------
Fetal Evaluation

 Num Of Fetuses:          1
 Fetal Heart Rate(bpm):   128
 Cardiac Activity:        Observed
 Presentation:            Cephalic
 Placenta:                Posterior
 P. Cord Insertion:       Visualized, central

 Amniotic Fluid
 AFI FV:      Within normal limits

 AFI Sum(cm)     %Tile       Largest Pocket(cm)
 12.46           35

 RUQ(cm)       RLQ(cm)       LUQ(cm)        LLQ(cm)
 3.55          0
Biometry

 BPD:      78.9  mm     G. Age:  31w 5d         35  %    CI:        71.22   %    70 - 86
                                                         FL/HC:       19.6  %    19.1 -
 HC:      297.8  mm     G. Age:  33w 0d         43  %    HC/AC:       1.09       0.96 -
 AC:      273.4  mm     G. Age:  31w 3d         36  %    FL/BPD:      74.1  %    71 - 87
 FL:       58.5  mm     G. Age:  30w 4d         10  %    FL/AC:       21.4  %    20 - 24
 HUM:      52.3  mm     G. Age:  30w 3d         23  %
 CER:        39  mm     G. Age:  33w 0d         70  %

 CM:        9.6  mm

 Est. FW:    9877   gm   3 lb 14 oz      46  %
OB History

 Gravidity:    2         Term:   0        Prem:   1        SAB:   0
 TOP:          0       Ectopic:  0        Living: 1
Gestational Age

 U/S Today:     31w 5d                                        EDD:   04/25/19
 Best:          31w 6d     Det. By:  Early Ultrasound         EDD:   04/24/19
                                     (09/02/18)
Anatomy

 Cranium:               Appears normal         Aortic Arch:            Not well visualized
 Cavum:                 Appears normal         Ductal Arch:            Not well visualized
 Ventricles:            Appears normal         Diaphragm:              Appears normal
 Choroid Plexus:        Appears normal         Stomach:                Appears normal, left
                                                                       sided
 Cerebellum:            Appears normal         Abdomen:                Appears normal
 Posterior Fossa:       Appears normal         Abdominal Wall:         Appears nml (cord
                                                                       insert, abd wall)
 Nuchal Fold:           Not applicable (>20    Cord Vessels:           Appears normal (3
                        wks GA)                                        vessel cord)
 Face:                  Appears normal         Kidneys:                Appear normal
                        (orbits and profile)
 Lips:                  Appears normal         Bladder:                Appears normal
 Thoracic:              Appears normal         Spine:                  Appears normal
 Heart:                 Appears normal         Upper Extremities:      Appears normal
                        (4CH, axis, and
                        situs)
 RVOT:                  Appears normal         Lower Extremities:      Appears normal
 LVOT:                  Appears normal

 Other:  Fetus appears to be a male. 3VV visualized. Technically difficult due
         to advanced gestational age.
Cervix Uterus Adnexa

 Cervix
 Not visualized (advanced GA >83wks)

 Uterus
 No abnormality visualized.

 Left Ovary
 No adnexal mass visualized.

 Right Ovary
 No adnexal mass visualized.

 Cul De Sac
 No free fluid seen.
 Adnexa
 No abnormality visualized.
Impression

 Normal interval growth.  No ultrasonic evidence of structural
 fetal anomalies.
 Suboptimal views of the fetal anatomy obtatined secondary to
 fetal gestational age.
Recommendations

 Follow up growth as clinically

## 2019-07-02 DIAGNOSIS — F4320 Adjustment disorder, unspecified: Secondary | ICD-10-CM | POA: Diagnosis not present

## 2019-07-04 DIAGNOSIS — H16223 Keratoconjunctivitis sicca, not specified as Sjogren's, bilateral: Secondary | ICD-10-CM | POA: Diagnosis not present

## 2019-07-04 DIAGNOSIS — H40033 Anatomical narrow angle, bilateral: Secondary | ICD-10-CM | POA: Diagnosis not present

## 2019-07-05 DIAGNOSIS — H5213 Myopia, bilateral: Secondary | ICD-10-CM | POA: Diagnosis not present

## 2019-07-07 DIAGNOSIS — F4320 Adjustment disorder, unspecified: Secondary | ICD-10-CM | POA: Diagnosis not present

## 2019-07-23 DIAGNOSIS — F4320 Adjustment disorder, unspecified: Secondary | ICD-10-CM | POA: Diagnosis not present

## 2019-07-29 DIAGNOSIS — F4320 Adjustment disorder, unspecified: Secondary | ICD-10-CM | POA: Diagnosis not present

## 2019-08-06 DIAGNOSIS — F4320 Adjustment disorder, unspecified: Secondary | ICD-10-CM | POA: Diagnosis not present

## 2019-08-17 DIAGNOSIS — F4320 Adjustment disorder, unspecified: Secondary | ICD-10-CM | POA: Diagnosis not present

## 2019-08-20 DIAGNOSIS — F4320 Adjustment disorder, unspecified: Secondary | ICD-10-CM | POA: Diagnosis not present

## 2019-08-24 ENCOUNTER — Encounter: Payer: Self-pay | Admitting: Advanced Practice Midwife

## 2019-08-24 ENCOUNTER — Ambulatory Visit (INDEPENDENT_AMBULATORY_CARE_PROVIDER_SITE_OTHER): Payer: Medicaid Other | Admitting: Advanced Practice Midwife

## 2019-08-24 ENCOUNTER — Other Ambulatory Visit: Payer: Self-pay

## 2019-08-24 VITALS — BP 113/68 | HR 77 | Ht 64.0 in | Wt 186.0 lb

## 2019-08-24 DIAGNOSIS — O99345 Other mental disorders complicating the puerperium: Secondary | ICD-10-CM

## 2019-08-24 DIAGNOSIS — G5603 Carpal tunnel syndrome, bilateral upper limbs: Secondary | ICD-10-CM | POA: Insufficient documentation

## 2019-08-24 DIAGNOSIS — N946 Dysmenorrhea, unspecified: Secondary | ICD-10-CM

## 2019-08-24 DIAGNOSIS — F53 Postpartum depression: Secondary | ICD-10-CM

## 2019-08-24 MED ORDER — IBUPROFEN 600 MG PO TABS: 600.0000 mg | ORAL_TABLET | Freq: Four times a day (QID) | ORAL | 1 refills | Status: DC | PRN

## 2019-08-24 MED ORDER — WRIST BRACE/LEFT SMALL MISC: 1.0000 | Freq: Every day | 0 refills | Status: DC

## 2019-08-24 MED ORDER — WRIST BRACE/RIGHT SMALL MISC: 1.0000 | Freq: Every day | 0 refills | Status: DC

## 2019-08-24 MED ORDER — SERTRALINE HCL 50 MG PO TABS: 50.0000 mg | ORAL_TABLET | Freq: Every day | ORAL | 5 refills | Status: DC

## 2019-08-24 NOTE — Progress Notes (Signed)
GYNECOLOGY PROGRESS NOTE  History:  18 y.o. [id number] presents to [facility] office today for problem visit for post-partum depression vs major despressive disorder. She reports daily ahedhonia, insomnia, irritability, hyperphagia, poor body-image, and feeling like she is trapped/ "dead inside". She reports that FOB has caused significant additional stress in her life by private and public critiques. She denies any physical harm. She reports that her mom provides good support, but does not have much other social/emotional support. She states that often she feels disconnected from her children and is slow to respond to her new son's cries. She reports that this all started after delivery of her son and has progressively worsened. She denies passive or active SI/HI. She states that she has intermittent episodes of depression throughout her life, but not in the past 2-3 years. She has been seeing a therapist for ~3 weeks and this has provided some help. She is open to medications. She also states that she has bilateral wrist pain that causes her hands to go numb, especially when lifting her son. She also reports significant pain with her periods. She denies h/a, dizziness, shortness of breath, n/v, or fever/chills.    The following portions of the patient's history were reviewed and updated as appropriate: allergies, current medications, past family history, past medical history, past social history, past surgical history and problem list.   Review of Systems:  Pertinent items are noted in HPI, otherwise 14 systems are negative.   Objective:  Physical Exam Blood pressure 113/68, pulse 77, height 5\' 4"  (1.626 m), weight 186 lb (84.4 kg), not currently breastfeeding. VS reviewed, nursing note reviewed,  Constitutional: well developed, well nourished, no distress HEENT: normocephalic CV: normal rate Pulm/chest wall: normal effort Breast Exam: deferred Abdomen: soft Neuro: alert and oriented x 3 Skin:  warm, dry Psych: tearful, appropriate affect, mood is down Pelvic exam: deferred  Assessment & Plan:   1. Postpartum depression Given Chynna's presentation of depressive s/s after childbirth and persistant symptoms for 3 months, her presentation is most concerning for PPD vs MDD that has acutely worsened due to life stressors. We will initiate Zoloft today. We counseled her regarding risks of initial worsening of symptoms of depression and SI upon starting this medication. She will need close follow-up with behavioral health and a psychiatric provider.  - Ambulatory referral to [facility] - Ambulatory referral to Integrated Behavioral [facility] - sertraline (ZOLOFT) 50 MG tablet; Take 1 tablet (50 mg total) by mouth daily. Take 1/2 tablet for the first 6 days, then switch to full 50 mg tablet daily  Dispense: 30 tablet; Refill: 5  2. Carpal tunnel syndrome, bilateral upper limbs Bilateral wrist pain with associated hand numbness most likely 2/2 carpal tunnel. Dequervain's tendonitis is also on the DDx. Will initiate night splinting for carpal tunnel and recommend f/u with PCP for long-term management. - Elastic Bandages & Supports (WRIST BRACE/LEFT SMALL) MISC; 1 Device by Does not apply route daily.  Dispense: 1 each; Refill: 0 - Elastic Bandages & Supports (WRIST BRACE/RIGHT SMALL) MISC; 1 Device by Does not apply route daily.  Dispense: 1 each; Refill: 0  3. Dysmenorrhea Allison mentioned painful periods during our visit today. She reported that ibuprofen has helped previously. Will revisit at 3 mo f/u visit with to re-evaluate whether she needs further treatment for dysmenorrhea.  - ibuprofen (ADVIL) 600 MG tablet; Take 1 tablet (600 mg total) by mouth every 6 (six) hours as needed.  Dispense: 30 tablet; Refill: 1  Follow-up in 3 months for PPD and Dysmenorrhea. Provided list of PCP and behavioral health providers in the area. Recommend she be connected to PCP who can manage and  coordinate her general medical care moving forward.   Kathryne Eriksson, Medical Student 4:55 PM

## 2019-08-24 NOTE — Patient Instructions (Signed)
Psychiatric Services °[facility]  °[address], [place], [place] °[phone] ° °[facility] °[address], [place], [place] °Helpline 336-832-9700 or 1-800-711-2635 °[email or web address] ° °[facility] °Walk-in Mon-Fri, 8:30-5:00 °[address], [place], [place] °[phone] °[email or web address]  °*Bring your own interpreter at 1st visit ° °[facility] °[address], [address], [place], [place] °[phone] °[email or web address]  ° °[facility]  °[address], [place], [place] °[phone] ° °[facility] °Walk-in Mon-Fri, 8am-3pm °[address], [place], [place] °[phone] °[email or web address] ° °Therapy/Counseling ° °[facility] °[address], [place], [place] °[phone] ° °[facility] °[address], [place], [place] °[phone] ° °[facility] °[address], [place], [place] °[phone] °*pacientes que hablen espanol, favor comunicarse con el Sr. [place], extension 2244 o la [address], extension 3331 para hacer una cita.  ° °[facility] °[address] [place]” °[phone] (Habla Espanol) ° °[facility] °[address], [place], [place] °[phone] ° °[facility] °[address], [place], [place] °[phone] °  °[facility]  °[address], [place], [place] °[phone] °[email or web address]  °*Uninsured and underinsured, ages 19-64 ° °[facility] °[address], [place], [place] °[phone] (Habla Espanol) ° °[facility] °[address], [place], [place] °[phone] (Habla Espanol) ° °[facility] °[address], [place], [place] °[phone] (Habla  Espanol) ° °[facility] °Mon-Thurs 8:30am-8:00pm/ Fri 8:30-7:00pm °[address], [place], [place] °(3rd floor, located at corner of [address]) °Call [phone] to schedule an appointment °[email or web address] ° °[facility] °[address], [place], [place]  °[phone] ° °[facility]  °[address], [place], [place] °[phone] ° °Social Support °[facility] ([facility] ) °(336) 373-1402 or www.mhag.org °[address], [address], ,  [address] °* Recovery support and educational programs, including recovery skills classes, support groups, and one-on-one sessions with  Certified Peer Support Specialists.  ° ° °[facility] ([employer] [facility]) [place] °[facility] Helpline: (336) 370-4264 °* Family and Friends Support Group/  Contact Jack Glenn at [phone] for more information °* Family to Family Education Program and Basics Class : enroll online or email Mary Pennington at [email or web address]  °* Monthly educational meetings, contact Mitch McGee at [phone] °[email or web address]  ° ° °24- Hour Availability:  °*[facility] 336-832-9700 or 1-800-711-2635 ° °* [facility] Crisis (Domestic Violence, Rape, Victim Assistance) Line 336-273-7273 ° °* [facility] 1-855-788-8787 or 336-676-6840 ° °* [facility] [place] [facility]  °336-899-1505(8am-4pm only) °1-866-261-5769(after hours) ° °*[facility] Mobile [facility] °1-877-626-1772 ° °*[place] National Suicide Hotline °1-800-273-8255 ° ° °

## 2019-08-24 NOTE — Progress Notes (Signed)
GYN 3 months PP presents for Depression Screening. PHQ-9=20.  List of PCP's given to patient.

## 2019-08-25 ENCOUNTER — Ambulatory Visit: Payer: Medicaid Other | Admitting: Clinical

## 2019-08-25 ENCOUNTER — Encounter: Payer: Self-pay | Admitting: Medical

## 2019-08-25 DIAGNOSIS — Z5329 Procedure and treatment not carried out because of patient's decision for other reasons: Secondary | ICD-10-CM

## 2019-08-25 DIAGNOSIS — G5603 Carpal tunnel syndrome, bilateral upper limbs: Secondary | ICD-10-CM | POA: Diagnosis not present

## 2019-08-25 DIAGNOSIS — Z91199 Patient's noncompliance with other medical treatment and regimen due to unspecified reason: Secondary | ICD-10-CM

## 2019-08-25 NOTE — BH Specialist Note (Signed)
Pt did not arrive to video visit and did not answer the phone; Left HIPPA-compliant message to call back Stacy Sellers from [facility] at [phone].    Integrated [facility]al Health via Telemedicine Video Visit  08/25/2019 Stacy Sellers [phone]   Garlan Fair  Depression screen [facility] 2/9 08/24/2019 01/23/2019  Decreased Interest 0 3  Down, Depressed, Hopeless 3 3  PHQ - 2 Score 3 6  Altered sleeping 3 3  Tired, decreased energy 3 3  Change in appetite 3 3  Feeling bad or failure about yourself  1 2  Trouble concentrating 3 2  Moving slowly or fidgety/restless 3 0  Suicidal thoughts 1 0  PHQ-9 Score 20 19  Difficult doing work/chores Very difficult Somewhat difficult

## 2019-09-09 ENCOUNTER — Other Ambulatory Visit: Payer: Self-pay

## 2019-09-09 ENCOUNTER — Ambulatory Visit (INDEPENDENT_AMBULATORY_CARE_PROVIDER_SITE_OTHER): Payer: Medicaid Other | Admitting: Clinical

## 2019-09-09 DIAGNOSIS — F4323 Adjustment disorder with mixed anxiety and depressed mood: Secondary | ICD-10-CM | POA: Diagnosis not present

## 2019-09-09 NOTE — BH Specialist Note (Signed)
Integrated Behavioral Health via Telemedicine Video Visit  09/09/2019 AISHANI KALIS [id number]  Number of Integrated Behavioral Health visits: 1 Session Start time: 1:18  Session End time: 2:04 Total time: 35   Referring Provider: Sharen Counter, CNM Type of Visit: Video Patient/Family location: Home [facility] Provider location: [facility] All persons participating in visit: Patient Stacy Sellers  Confirmed patient's address: Yes  Confirmed patient's phone number: Yes  Any changes to demographics: No   Confirmed patient's insurance: Yes  Any changes to patient's insurance: No   Discussed confidentiality: Yes   I connected with Ashley Jacobs by a video enabled telemedicine application and verified that I am speaking with the correct person using two identifiers.     I discussed the limitations of evaluation and management by telemedicine and the availability of in person appointments.  I discussed that the purpose of this visit is to provide behavioral health care while limiting exposure to the novel coronavirus.   Discussed there is a possibility of technology failure and discussed alternative modes of communication if that failure occurs.  I discussed that engaging in this video visit, they consent to the provision of behavioral healthcare and the services will be billed under their insurance.  Patient and/or legal guardian expressed understanding and consented to video visit: Yes   PRESENTING CONCERNS: Patient and/or family reports the following symptoms/concerns:  Pt states her primary symptoms are depression, fatigue, lack of quality sleep, exhaustion, overeating, worry, difficulty relaxing, irritability; pt is sleeping poorly as baby is sleeping up to 4 hours/night only, and teething. Pt wakes every time baby wakes.  Duration of problem: Postpartum; Severity of problem: moderately severe  STRENGTHS (Protective Factors/Coping Skills): Supportive mother  GOALS ADDRESSED: Patient  will: 1.  Reduce symptoms of: anxiety, depression and insomnia  2.  Increase knowledge and/or ability of: healthy habits  3.  Demonstrate ability to: Increase healthy adjustment to current life circumstances, Increase adequate support systems for patient/family and Increase motivation to adhere to plan of care  INTERVENTIONS: Interventions utilized:  Sleep Hygiene, Psychoeducation and/or Health Education and Link to [employer] Standardized Assessments completed: GAD-7 and PHQ 9  ASSESSMENT: Patient currently experiencing Adjustment disorder with depressed and anxious mood  Patient may benefit from psychoeducation and brief therapeutic interventions regarding coping with symptoms of depression and anxiety .  PLAN: 1. Follow up with behavioral health clinician on : One week 2. Behavioral recommendations:  -Start new bedtime routine for baby/self: Lay down 30 minutes earlier than usual, in dark room, with sleep sounds, and allow baby to soothe self, if able, if he wakes with only mild fussing; sleep when he sleeps.  -Take baby outside for 20 minutes the first hour upon waking, daily for one week -Consider keeping track of hours slept this week -Consider new mom online support groups (will discuss further at next visit; sleep is priority this week) -Use link sent to phone to set up MyChart asap 3. Referral(s): Integrated Art gallery manager (In Clinic) and [employer]:  New mom support  I discussed the assessment and treatment plan with the patient and/or parent/guardian. They were provided an opportunity to ask questions and all were answered. They agreed with the plan and demonstrated an understanding of the instructions.   They were advised to call back or seek an in-person evaluation if the symptoms worsen or if the condition fails to improve as anticipated.  Valetta Close   Depression screen [facility] 2/9 09/09/2019 08/24/2019 01/23/2019  Decreased Interest 1 0 3  Down, Depressed, Hopeless 3 3 3   PHQ - 2 Score 4 3 6   Altered sleeping 3 3 3   Tired, decreased energy 3 3 3   Change in appetite 3 3 3   Feeling bad or failure about yourself  1 1 2   Trouble concentrating 1 3 2   Moving slowly or fidgety/restless 1 3 0  Suicidal thoughts 0 1 0  PHQ-9 Score 16 20 19   Difficult doing work/chores - Very difficult Somewhat difficult   GAD 7 : Generalized Anxiety Score 09/09/2019  Nervous, Anxious, on Edge 1  Control/stop worrying 1  Worry too much - different things 3  Trouble relaxing 3  Restless 1  Easily annoyed or irritable 3  Afraid - awful might happen 0  Total GAD 7 Score 12

## 2019-09-12 DIAGNOSIS — F4320 Adjustment disorder, unspecified: Secondary | ICD-10-CM | POA: Diagnosis not present

## 2019-09-15 ENCOUNTER — Telehealth: Payer: Self-pay | Admitting: Advanced Practice Midwife

## 2019-09-15 NOTE — Telephone Encounter (Signed)
Called pt to discuss options to improve sleep including medications.  Since she is recently postpartum, I want to discuss options for support from her family so she can get sleep.  I recommend Benadryl 25 mg at night if someone can help her attend to the baby during the night so she is safe.  Left message with pt to return call to [place].  Pt has follow up with York Cerise on 11/19.

## 2019-09-15 NOTE — Telephone Encounter (Signed)
-----   Message from Garlan Fair, [place] sent at 09/09/2019  5:51 PM EST ----- Pt would benefit from medication to help with sleep short-term; she is absolutely exhausted

## 2019-09-17 ENCOUNTER — Other Ambulatory Visit: Payer: Self-pay

## 2019-09-17 ENCOUNTER — Ambulatory Visit: Payer: Medicaid Other | Admitting: Clinical

## 2019-09-17 DIAGNOSIS — Z5329 Procedure and treatment not carried out because of patient's decision for other reasons: Secondary | ICD-10-CM

## 2019-09-17 DIAGNOSIS — Z91199 Patient's noncompliance with other medical treatment and regimen due to unspecified reason: Secondary | ICD-10-CM

## 2019-09-17 NOTE — BH Specialist Note (Signed)
Pt did not arrive to video visit and did not answer the phone ; Left HIPPA-compliant message to call back Stacy Sellers from [facility] [employer] at [phone].      [place] via Telemedicine Video Visit  09/17/2019 Stacy Sellers [id number]  Garlan Fair

## 2019-09-28 ENCOUNTER — Telehealth: Payer: Self-pay | Admitting: Clinical

## 2019-09-28 NOTE — Telephone Encounter (Signed)
Pt left message on 09/23/19 at 10:38am to call back; Left HIPPA-compliant message to call back Roselyn Reef from [facility] [employer] at [phone].

## 2019-09-30 ENCOUNTER — Telehealth: Payer: Self-pay | Admitting: Clinical

## 2019-09-30 DIAGNOSIS — F39 Unspecified mood [affective] disorder: Secondary | ICD-10-CM

## 2019-09-30 NOTE — Telephone Encounter (Signed)
Pt calls, says she is feeling "really depressed", "panic late at night, paranoid", nightmares, "loss of memory"; she slept 5 hours 2 nights ago, but very little sleep since and feeling very tired. Pt started having no appetite last week. She is requested "a different medicine, it's not working". Pt denies any current SI, and no SI in past week, though has had "thoughts" in the past, but "never wanted to hurt myself at all". Pt does not have a history or family history of bipolar disorder "that I know of".   Patient and/or legal guardian verbally consented to [facility] services about presenting concerns and psychiatric consultation as appropriate.  Pt agrees to a follow up call by [facility] today, after making an attempt to consult with consulting psychiatrist today.

## 2019-09-30 NOTE — Addendum Note (Signed)
Addended by: Vesta Mixer C on: 09/30/2019 04:32 PM   Modules accepted: Orders

## 2019-09-30 NOTE — Telephone Encounter (Signed)
Follow up call, as agreed upon by pt and [facility]. Pt agrees to referral to psychiatry, knowing that she will likely not get in very quickly. Pt is informed that [facility] is consulting with our referring psychiatrist, Dr. Elvin So,  and her medical provider to discuss any changes to her medication, and will contact her back if and when changes are made. Pt denies SI, and has Mobile Crisis number she will call if symptoms worsen.

## 2019-09-30 NOTE — Addendum Note (Signed)
Addended by: Vesta Mixer C on: 09/30/2019 05:16 PM   Modules accepted: Orders

## 2019-10-02 ENCOUNTER — Telehealth: Payer: Self-pay | Admitting: Advanced Practice Midwife

## 2019-10-02 ENCOUNTER — Telehealth: Payer: Self-pay | Admitting: Clinical

## 2019-10-02 DIAGNOSIS — G479 Sleep disorder, unspecified: Secondary | ICD-10-CM

## 2019-10-02 MED ORDER — TRAZODONE HCL 50 MG PO TABS: 50.0000 mg | ORAL_TABLET | Freq: Every evening | ORAL | 0 refills | Status: DC | PRN

## 2019-10-02 NOTE — Telephone Encounter (Signed)
Pt confirms that her mother and sister will both be available at home to care for baby at night, and that pt is not co-sleeping with baby; baby has a crib to sleep. Pt agrees that if she is started on medication to help with sleep, both her mother and sister will care for baby at night, while pt is sleeping.

## 2019-10-02 NOTE — Telephone Encounter (Signed)
Following 2 counseling sessions with integrated behavioral health, a sleep medication was recommended for Stacy Sellers.  Education was provided and pt has a plan for her mother and sister to care for the newborn during the night when she is taking a sleep medication.  The infant is sleeping alone in a crib/bassinett and not co-sleeping with Stacy Sellers.  Rx for Trazodone 50 mg Q HS PRN x 10 tabs sent to [place].  Called and left message with Ms. Achee about the new prescription and reminded her to only take the medication if she has help with the baby at night.  Will follow up in 2 weeks with a phone call to see how she is doing.

## 2019-10-19 DIAGNOSIS — F4320 Adjustment disorder, unspecified: Secondary | ICD-10-CM | POA: Diagnosis not present

## 2019-10-21 ENCOUNTER — Telehealth: Payer: Self-pay

## 2019-10-21 NOTE — Telephone Encounter (Signed)
Pt called c/o red/brown discharge. No pain , no nausea or vomiting Pt notes recently discontinuing birth control Pt advised spotting could be from that and she can wait and monitor and see how spotting does then follow/ up  Pt made aware sx's that she would need immediate assistance.   Pt requested appt pt infor given to appts.

## 2019-10-29 ENCOUNTER — Ambulatory Visit: Payer: Medicaid Other

## 2019-11-04 DIAGNOSIS — F4320 Adjustment disorder, unspecified: Secondary | ICD-10-CM | POA: Diagnosis not present

## 2019-11-11 DIAGNOSIS — F4320 Adjustment disorder, unspecified: Secondary | ICD-10-CM | POA: Diagnosis not present

## 2019-11-12 ENCOUNTER — Ambulatory Visit (INDEPENDENT_AMBULATORY_CARE_PROVIDER_SITE_OTHER): Payer: Medicaid Other | Admitting: Obstetrics

## 2019-11-12 ENCOUNTER — Ambulatory Visit (INDEPENDENT_AMBULATORY_CARE_PROVIDER_SITE_OTHER): Payer: Self-pay | Admitting: Obstetrics

## 2019-11-12 ENCOUNTER — Other Ambulatory Visit (HOSPITAL_COMMUNITY)
Admission: RE | Admit: 2019-11-12 | Discharge: 2019-11-12 | Disposition: A | Discharge status: Home / Self Care | Payer: Medicaid Other | Source: Ambulatory Visit | Attending: Obstetrics | Admitting: Obstetrics

## 2019-11-12 ENCOUNTER — Other Ambulatory Visit: Payer: Self-pay

## 2019-11-12 ENCOUNTER — Encounter: Payer: Self-pay | Admitting: Obstetrics

## 2019-11-12 VITALS — BP 112/69 | HR 112 | Temp 98.3°F | Wt 183.9 lb

## 2019-11-12 VITALS — BP 112/69 | HR 112 | Temp 98.3°F | Ht 63.0 in | Wt 184.0 lb

## 2019-11-12 DIAGNOSIS — B369 Superficial mycosis, unspecified: Secondary | ICD-10-CM

## 2019-11-12 DIAGNOSIS — Z113 Encounter for screening for infections with a predominantly sexual mode of transmission: Secondary | ICD-10-CM | POA: Diagnosis not present

## 2019-11-12 DIAGNOSIS — N898 Other specified noninflammatory disorders of vagina: Secondary | ICD-10-CM | POA: Diagnosis not present

## 2019-11-12 DIAGNOSIS — B373 Candidiasis of vulva and vagina: Secondary | ICD-10-CM

## 2019-11-12 DIAGNOSIS — T148XXA Other injury of unspecified body region, initial encounter: Secondary | ICD-10-CM

## 2019-11-12 DIAGNOSIS — B3731 Acute candidiasis of vulva and vagina: Secondary | ICD-10-CM

## 2019-11-12 MED ORDER — CLOTRIMAZOLE 1 % EX CREA: 1.0000 "application " | TOPICAL_CREAM | Freq: Two times a day (BID) | CUTANEOUS | 0 refills | Status: DC

## 2019-11-12 MED ORDER — FLUCONAZOLE 200 MG PO TABS: 200.0000 mg | ORAL_TABLET | ORAL | 2 refills | Status: DC

## 2019-11-12 NOTE — Progress Notes (Addendum)
Patient needs to see provider for HSV culture, coming back at 1 pm to see a Provider.  Patient seen and assessed by nursing staff during this encounter. I have reviewed the chart and agree with the documentation and plan.  Coral Ceo, MD 11/17/2019 12:07 PM

## 2019-11-12 NOTE — Progress Notes (Signed)
Patient ID: Stacy Sellers, female   DOB: May 02, 2001, 19 y.o.   MRN: [id number]  Chief Complaint  Patient presents with  . vaginal lesion    HPI Stacy Sellers is a 19 y.o. female.  Itching and scratching in external vaginal, perineal and in crease of buttocks.  Also c/o vaginal discharge and itching. HPI  Past Medical History:  Diagnosis Date  . Medical history non-contributory     Past Surgical History:  Procedure Laterality Date  . NO PAST SURGERIES      Family History  Problem Relation Age of Onset  . Hypertension Maternal Grandmother     Social History Social History   Tobacco Use  . Smoking status: Never Smoker  . Smokeless tobacco: Never Used  Substance Use Topics  . Alcohol use: No  . Drug use: No    No Known Allergies  Current Outpatient Medications  Medication Sig Dispense Refill  . acetaminophen (TYLENOL 8 HOUR) 650 MG CR tablet Take 1 tablet (650 mg total) by mouth every 4 (four) hours as needed for pain. (Patient not taking: Reported on 06/09/2019) 300 tablet 0  . clotrimazole (LOTRIMIN) 1 % cream Apply 1 application topically 2 (two) times daily. 30 g 0  . Elastic Bandages & Supports (WRIST BRACE/LEFT SMALL) MISC 1 Device by Does not apply route daily. (Patient not taking: Reported on 11/12/2019) 1 each 0  . Elastic Bandages & Supports (WRIST BRACE/RIGHT SMALL) MISC 1 Device by Does not apply route daily. (Patient not taking: Reported on 11/12/2019) 1 each 0  . fluconazole (DIFLUCAN) 200 MG tablet Take 1 tablet (200 mg total) by mouth every 3 (three) days. 3 tablet 2  . ibuprofen (ADVIL) 600 MG tablet Take 1 tablet (600 mg total) by mouth every 6 (six) hours as needed. (Patient not taking: Reported on 11/12/2019) 30 tablet 1  . ibuprofen (ADVIL) 600 MG tablet Take 1 tablet (600 mg total) by mouth every 6 (six) hours as needed. (Patient not taking: Reported on 11/12/2019) 30 tablet 1  . levonorgestrel-ethinyl estradiol (NORDETTE) 0.15-30 MG-MCG tablet Take 1 tablet  by mouth daily. (Patient not taking: Reported on 11/12/2019) 1 Package 2  . Prenatal Vit-Fe Fumarate-FA (PRENATAL COMPLETE) 14-0.4 MG TABS Take 1 tablet by mouth daily. (Patient not taking: Reported on 06/09/2019) 60 each 0  . sertraline (ZOLOFT) 50 MG tablet Take 1 tablet (50 mg total) by mouth daily. Take 1/2 tablet for the first 6 days, then switch to full 50 mg tablet daily (Patient not taking: Reported on 11/12/2019) 30 tablet 5  . traZODone (DESYREL) 50 MG tablet Take 1 tablet (50 mg total) by mouth at bedtime as needed for up to 10 doses for sleep. (Patient not taking: Reported on 11/12/2019) 10 tablet 0   No current facility-administered medications for this visit.    Review of Systems Review of Systems Constitutional: negative for fatigue and weight loss Respiratory: negative for cough and wheezing Cardiovascular: negative for chest pain, fatigue and palpitations Gastrointestinal: negative for abdominal pain and change in bowel habits Genitourinary:negative Integument/breast: negative for nipple discharge Musculoskeletal:negative for myalgias Neurological: negative for gait problems and tremors Behavioral/Psych: negative for abusive relationship, depression Endocrine: negative for temperature intolerance      Blood pressure 112/69, pulse (!) 112, temperature 98.3 F (36.8 C), weight 183 lb 14.4 oz (83.4 kg), last menstrual period 10/12/2019, not currently breastfeeding.  Physical Exam Physical Exam General:   alert  Skin:   no rash or abnormalities  Lungs:  clear to auscultation bilaterally  Heart:   regular rate and rhythm, S1, S2 normal, no murmur, click, rub or gallop  Breasts:   normal without suspicious masses, skin or nipple changes or axillary nodes  Abdomen:  normal findings: no organomegaly, soft, non-tender and no hernia  Pelvis:  External genitalia: whitish discoloration and excoriations of vulva and perineal areas Urinary system: urethral meatus normal and bladder  without fullness, nontender Vaginal: normal without tenderness, induration or masses Cervix: normal appearance Adnexa: normal bimanual exam Uterus: anteverted and non-tender, normal size    50% of 20 min visit spent on counseling and coordination of care.   Data Reviewed Labs  Assessment     1. Superficial fungus infection of skin Rx: - clotrimazole (LOTRIMIN) 1 % cream; Apply 1 application topically 2 (two) times daily.  Dispense: 30 g; Refill: 0  2. Skin excoriation Rx: - Herpes simplex virus culture  3. Vaginal discharge Rx: - Cervicovaginal ancillary only( [place])  4. Candida vaginitis Rx: - Cervicovaginal ancillary only( [place]) - fluconazole (DIFLUCAN) 200 MG tablet; Take 1 tablet (200 mg total) by mouth every 3 (three) days.  Dispense: 3 tablet; Refill: 2  5. Screen for STD (sexually transmitted disease) Rx: - HIV antibody     Plan    Orders Placed This Encounter  Procedures  . Herpes simplex virus culture  . Hepatitis B surface antigen  . Hepatitis C antibody  . HIV antibody  . RPR   Meds ordered this encounter  Medications  . clotrimazole (LOTRIMIN) 1 % cream    Sig: Apply 1 application topically 2 (two) times daily.    Dispense:  30 g    Refill:  0  . fluconazole (DIFLUCAN) 200 MG tablet    Sig: Take 1 tablet (200 mg total) by mouth every 3 (three) days.    Dispense:  3 tablet    Refill:  2      Brock Bad, MD 11/12/2019 3:07 PM

## 2019-11-12 NOTE — Progress Notes (Signed)
Pt requests all STD screening. Pt has vaginal lesions; denies pain, no pus.  Pt c/o vaginal discharge with itching.

## 2019-11-13 LAB — HIV ANTIBODY (ROUTINE TESTING W REFLEX): HIV Screen 4th Generation wRfx: NONREACTIVE

## 2019-11-13 LAB — HEPATITIS C ANTIBODY: Hep C Virus Ab: <0.1 s/co ratio (ref 0.0–0.9)

## 2019-11-13 LAB — HEPATITIS B SURFACE ANTIGEN: Hepatitis B Surface Ag: NEGATIVE

## 2019-11-13 LAB — RPR: RPR Ser Ql: NONREACTIVE

## 2019-11-15 LAB — HERPES SIMPLEX VIRUS CULTURE

## 2019-11-16 LAB — CERVICOVAGINAL ANCILLARY ONLY
Bacterial Vaginitis (gardnerella): NEGATIVE
Candida Glabrata: NEGATIVE
Candida Vaginitis: POSITIVE — AB
Chlamydia: NEGATIVE
Comment: NEGATIVE
Comment: NEGATIVE
Comment: NEGATIVE
Comment: NEGATIVE
Comment: NEGATIVE
Comment: NORMAL
Neisseria Gonorrhea: NEGATIVE
Trichomonas: NEGATIVE

## 2019-11-17 ENCOUNTER — Other Ambulatory Visit: Payer: Self-pay | Admitting: Obstetrics

## 2019-12-26 DIAGNOSIS — H16223 Keratoconjunctivitis sicca, not specified as Sjogren's, bilateral: Secondary | ICD-10-CM | POA: Diagnosis not present

## 2019-12-26 DIAGNOSIS — H40033 Anatomical narrow angle, bilateral: Secondary | ICD-10-CM | POA: Diagnosis not present

## 2019-12-30 DIAGNOSIS — F4320 Adjustment disorder, unspecified: Secondary | ICD-10-CM | POA: Diagnosis not present

## 2020-01-07 DIAGNOSIS — F4320 Adjustment disorder, unspecified: Secondary | ICD-10-CM | POA: Diagnosis not present

## 2020-01-14 DIAGNOSIS — F4320 Adjustment disorder, unspecified: Secondary | ICD-10-CM | POA: Diagnosis not present

## 2020-01-19 DIAGNOSIS — F4320 Adjustment disorder, unspecified: Secondary | ICD-10-CM | POA: Diagnosis not present

## 2020-01-28 DIAGNOSIS — F4320 Adjustment disorder, unspecified: Secondary | ICD-10-CM | POA: Diagnosis not present

## 2020-02-04 DIAGNOSIS — F4320 Adjustment disorder, unspecified: Secondary | ICD-10-CM | POA: Diagnosis not present

## 2020-02-11 DIAGNOSIS — F4320 Adjustment disorder, unspecified: Secondary | ICD-10-CM | POA: Diagnosis not present

## 2020-02-18 DIAGNOSIS — F4320 Adjustment disorder, unspecified: Secondary | ICD-10-CM | POA: Diagnosis not present

## 2020-02-25 DIAGNOSIS — F4320 Adjustment disorder, unspecified: Secondary | ICD-10-CM | POA: Diagnosis not present

## 2020-03-01 DIAGNOSIS — F4320 Adjustment disorder, unspecified: Secondary | ICD-10-CM | POA: Diagnosis not present

## 2020-03-08 DIAGNOSIS — F4320 Adjustment disorder, unspecified: Secondary | ICD-10-CM | POA: Diagnosis not present

## 2020-03-15 DIAGNOSIS — F4320 Adjustment disorder, unspecified: Secondary | ICD-10-CM | POA: Diagnosis not present

## 2020-03-22 DIAGNOSIS — F4320 Adjustment disorder, unspecified: Secondary | ICD-10-CM | POA: Diagnosis not present

## 2020-03-30 DIAGNOSIS — F4320 Adjustment disorder, unspecified: Secondary | ICD-10-CM | POA: Diagnosis not present

## 2020-04-06 DIAGNOSIS — F4320 Adjustment disorder, unspecified: Secondary | ICD-10-CM | POA: Diagnosis not present

## 2020-04-13 DIAGNOSIS — F4320 Adjustment disorder, unspecified: Secondary | ICD-10-CM | POA: Diagnosis not present

## 2020-04-20 DIAGNOSIS — F4320 Adjustment disorder, unspecified: Secondary | ICD-10-CM | POA: Diagnosis not present

## 2020-04-27 DIAGNOSIS — F4320 Adjustment disorder, unspecified: Secondary | ICD-10-CM | POA: Diagnosis not present

## 2020-06-01 ENCOUNTER — Other Ambulatory Visit: Payer: Self-pay | Admitting: Nurse Practitioner

## 2020-10-09 ENCOUNTER — Emergency Department (HOSPITAL_COMMUNITY)
Admission: EM | Admit: 2020-10-09 | Discharge: 2020-10-09 | Disposition: A | Discharge status: Home / Self Care | Payer: Medicaid Other | Attending: Emergency Medicine | Admitting: Emergency Medicine

## 2020-10-09 ENCOUNTER — Other Ambulatory Visit: Payer: Self-pay

## 2020-10-09 ENCOUNTER — Encounter (HOSPITAL_COMMUNITY): Payer: Self-pay | Admitting: Emergency Medicine

## 2020-10-09 DIAGNOSIS — B354 Tinea corporis: Secondary | ICD-10-CM | POA: Insufficient documentation

## 2020-10-09 DIAGNOSIS — R0789 Other chest pain: Secondary | ICD-10-CM | POA: Diagnosis not present

## 2020-10-09 DIAGNOSIS — Z79899 Other long term (current) drug therapy: Secondary | ICD-10-CM | POA: Diagnosis not present

## 2020-10-09 DIAGNOSIS — B359 Dermatophytosis, unspecified: Secondary | ICD-10-CM

## 2020-10-09 DIAGNOSIS — R21 Rash and other nonspecific skin eruption: Secondary | ICD-10-CM | POA: Diagnosis present

## 2020-10-09 MED ORDER — TERBINAFINE HCL 250 MG PO TABS: 250.0000 mg | ORAL_TABLET | Freq: Every day | ORAL | 0 refills | Status: DC

## 2020-10-09 NOTE — ED Provider Notes (Signed)
[facility] EMERGENCY DEPARTMENT Provider Note   CSN: [id number] Arrival date & time: 10/09/20  1145     History Chief Complaint  Patient presents with  . Rash    Stacy Sellers is a 19 y.o. female here with possible ring worm.  Patient states that her son was recently diagnosed with ringworm on his scalp.  She states that they are in constant contact all the time.  She noticed rash on the left hand. She was concerned she has ringworm.  Of note she also has been having some chest pressure for the last several weeks.  She states that it comes and goes and is now worse with exertion.  Denies any shortness of breath.  Denies any palpitations or history of blood clots or stents.  The history is provided by the patient.       Past Medical History:  Diagnosis Date  . Medical history non-contributory     Patient Active Problem List   Diagnosis Date Noted  . Carpal tunnel syndrome, bilateral upper limbs 08/24/2019  . Dysmenorrhea 08/24/2019  . Encounter for initial prescription of contraceptive pills 06/09/2019  . Postpartum depression 06/09/2019  . History of premature delivery 12/30/2018    Past Surgical History:  Procedure Laterality Date  . NO PAST SURGERIES       OB History    Gravida  2   Para  2   Term  1   Preterm  1   AB      Living  2     SAB      IAB      Ectopic      Multiple  0   Live Births  2           Family History  Problem Relation Age of Onset  . Hypertension Maternal Grandmother     Social History   Tobacco Use  . Smoking status: Never Smoker  . Smokeless tobacco: Never Used  Substance Use Topics  . Alcohol use: No  . Drug use: No    Home Medications Prior to Admission medications   Medication Sig Start Date End Date Taking? Authorizing Provider  acetaminophen (TYLENOL 8 HOUR) 650 MG CR tablet Take 1 tablet (650 mg total) by mouth every 4 (four) hours as needed for pain. Patient not taking: Reported  on 06/09/2019 03/29/19   Calvert Cantor, CNM  clotrimazole (LOTRIMIN) 1 % cream Apply 1 application topically 2 (two) times daily. 11/12/19   Brock Bad, MD  Elastic Bandages & Supports (WRIST BRACE/LEFT SMALL) MISC 1 Device by Does not apply route daily. Patient not taking: Reported on 11/12/2019 08/24/19   Hurshel Party, CNM  Elastic Bandages & Supports (WRIST BRACE/RIGHT SMALL) MISC 1 Device by Does not apply route daily. Patient not taking: Reported on 11/12/2019 08/24/19   Sharen Counter A, CNM  fluconazole (DIFLUCAN) 200 MG tablet Take 1 tablet (200 mg total) by mouth every 3 (three) days. 11/12/19   Brock Bad, MD  ibuprofen (ADVIL) 600 MG tablet Take 1 tablet (600 mg total) by mouth every 6 (six) hours as needed. Patient not taking: Reported on 11/12/2019 06/09/19   Currie Paris, NP  ibuprofen (ADVIL) 600 MG tablet Take 1 tablet (600 mg total) by mouth every 6 (six) hours as needed. Patient not taking: Reported on 11/12/2019 08/24/19   Hurshel Party, CNM  levonorgestrel-ethinyl estradiol (NORDETTE) 0.15-30 MG-MCG tablet Take 1 tablet by mouth daily. Patient not  taking: Reported on 11/12/2019 06/09/19   Currie Paris, NP  Prenatal Vit-Fe Fumarate-FA (PRENATAL COMPLETE) 14-0.4 MG TABS Take 1 tablet by mouth daily. Patient not taking: Reported on 06/09/2019 09/02/18   Dietrich Pates, PA-C  sertraline (ZOLOFT) 50 MG tablet Take 1 tablet (50 mg total) by mouth daily. Take 1/2 tablet for the first 6 days, then switch to full 50 mg tablet daily Patient not taking: Reported on 11/12/2019 08/24/19   Hurshel Party, CNM  traZODone (DESYREL) 50 MG tablet Take 1 tablet (50 mg total) by mouth at bedtime as needed for up to 10 doses for sleep. Patient not taking: Reported on 11/12/2019 10/02/19   Hurshel Party, CNM    Allergies    Patient has no known allergies.  Review of Systems   Review of Systems  Skin: Positive for rash.  All other systems  reviewed and are negative.   Physical Exam Updated Vital Signs BP 105/77 (BP Location: Left Arm)   Pulse 82   Temp 98.3 F (36.8 C) (Oral)   Resp 16   LMP 09/07/2020   SpO2 98%   Physical Exam Vitals and nursing note reviewed.  HENT:     Head: Normocephalic.     Nose: Nose normal.     Mouth/Throat:     Mouth: Mucous membranes are moist.  Eyes:     Extraocular Movements: Extraocular movements intact.     Pupils: Pupils are equal, round, and reactive to light.  Cardiovascular:     Rate and Rhythm: Normal rate and regular rhythm.     Pulses: Normal pulses.     Heart sounds: Normal heart sounds.  Pulmonary:     Effort: Pulmonary effort is normal.     Breath sounds: Normal breath sounds.  Abdominal:     General: Abdomen is flat.     Palpations: Abdomen is soft.  Musculoskeletal:        General: Normal range of motion.     Cervical back: Normal range of motion and neck supple.  Skin:    General: Skin is warm.     Capillary Refill: Capillary refill takes less than 2 seconds.     Comments: On the dorsum aspect of the left hand there is a rash consistent with ringworm.  Another area in the left 2nd finger.  There is no overlying cellulitis.   Neurological:     General: No focal deficit present.     Mental Status: She is alert and oriented to person, place, and time.  Psychiatric:        Mood and Affect: Mood normal.        Behavior: Behavior normal.     ED Results / Procedures / Treatments   Labs (all labs ordered are listed, but only abnormal results are displayed) Labs Reviewed - No data to display  EKG EKG Interpretation  Date/Time:  Sunday October 09 2020 15:53:10 EST Ventricular Rate:  60 PR Interval:    QRS Duration: 79 QT Interval:  382 QTC Calculation: 382 R Axis:   39 Text Interpretation: Sinus rhythm Borderline short PR interval No previous ECGs available Confirmed by Richardean Canal [phone]) on 10/09/2020 4:05:43 PM   Radiology No results  found.  Procedures Procedures (including critical care time)  Medications Ordered in ED Medications - No data to display  ED Course  I have reviewed the triage vital signs and the nursing notes.  Pertinent labs & imaging results that were available during my care of  the patient were reviewed by me and considered in my medical decision making (see chart for details).    MDM Rules/Calculators/A&P                         SHINITA MAC is a 19 y.o. female here with left hand rash likely ringworm.  Please no superimposed cellulitis. Has chest pressure for a week and I doubt ACS or PE. EKG unremarkable. Has no liver problem. Will dc home with terbinafine.    Final Clinical Impression(s) / ED Diagnoses Final diagnoses:  Tinea    Rx / DC Orders ED Discharge Orders    None       Charlynne Pander, MD 10/09/20 1606

## 2020-10-09 NOTE — ED Triage Notes (Signed)
Pt states her son diagnosed with ringworm 2 days ago.  Pt woke up today with similar spot on L wrist and L index finger.

## 2020-10-09 NOTE — Discharge Instructions (Addendum)
Your EKG is unremarkable today.   Take terbinafine for 6 weeks   See your doctor   Return to ER if you have worse chest pain, worse rash, fever.

## 2020-10-10 ENCOUNTER — Telehealth: Payer: Self-pay

## 2020-10-10 NOTE — Telephone Encounter (Signed)
Transition Care Management Unsuccessful Follow-up Telephone Call  Date of discharge and from where:  10/09/2020 [facility]  Attempts:  1st Attempt  Reason for unsuccessful TCM follow-up call:  Left voice message

## 2020-10-11 NOTE — Telephone Encounter (Signed)
Transition Care Management Unsuccessful Follow-up Telephone Call  Date of discharge and from where:  [facility]  Attempts:  2nd Attempt  Reason for unsuccessful TCM follow-up call:  Left voice message

## 2020-10-12 NOTE — Telephone Encounter (Signed)
Transition Care Management Unsuccessful Follow-up Telephone Call  Date of discharge and from where:   10/09/2020 [facility]  Attempts:  3rd Attempt  Reason for unsuccessful TCM follow-up call:  Left voice message

## 2020-11-24 ENCOUNTER — Emergency Department (HOSPITAL_COMMUNITY)
Admission: EM | Admit: 2020-11-24 | Discharge: 2020-11-24 | Discharge status: Against Medical Advice | Payer: Self-pay | Attending: Emergency Medicine | Admitting: Emergency Medicine

## 2020-11-24 ENCOUNTER — Other Ambulatory Visit: Payer: Self-pay

## 2020-11-24 ENCOUNTER — Emergency Department (HOSPITAL_COMMUNITY): Payer: Self-pay

## 2020-11-24 ENCOUNTER — Encounter (HOSPITAL_COMMUNITY): Payer: Self-pay | Admitting: Emergency Medicine

## 2020-11-24 DIAGNOSIS — N898 Other specified noninflammatory disorders of vagina: Secondary | ICD-10-CM | POA: Insufficient documentation

## 2020-11-24 DIAGNOSIS — F17200 Nicotine dependence, unspecified, uncomplicated: Secondary | ICD-10-CM | POA: Insufficient documentation

## 2020-11-24 DIAGNOSIS — N72 Inflammatory disease of cervix uteri: Secondary | ICD-10-CM | POA: Insufficient documentation

## 2020-11-24 DIAGNOSIS — N7093 Salpingitis and oophoritis, unspecified: Secondary | ICD-10-CM

## 2020-11-24 LAB — BASIC METABOLIC PANEL
Anion gap: 12 (ref 5–15)
BUN: <5 mg/dL — ABNORMAL LOW (ref 6–20)
CO2: 24 mmol/L (ref 22–32)
Calcium: 9.4 mg/dL (ref 8.9–10.3)
Chloride: 105 mmol/L (ref 98–111)
Creatinine, Ser: 0.69 mg/dL (ref 0.44–1.00)
GFR, Estimated: >60 mL/min (ref >60)
Glucose, Bld: 89 mg/dL (ref 70–99)
Potassium: 3.5 mmol/L (ref 3.5–5.1)
Sodium: 141 mmol/L (ref 135–145)

## 2020-11-24 LAB — CBC WITH DIFFERENTIAL/PLATELET
Abs Immature Granulocytes: 0.01 10*3/uL (ref 0.00–0.07)
Basophils Absolute: 0 10*3/uL (ref 0.0–0.1)
Basophils Relative: 0 %
Eosinophils Absolute: 0 10*3/uL (ref 0.0–0.5)
Eosinophils Relative: 1 %
HCT: 43.4 % (ref 36.0–46.0)
Hemoglobin: 14.5 g/dL (ref 12.0–15.0)
Immature Granulocytes: 0 %
Lymphocytes Relative: 42 %
Lymphs Abs: 2.3 10*3/uL (ref 0.7–4.0)
MCH: 31.9 pg (ref 26.0–34.0)
MCHC: 33.4 g/dL (ref 30.0–36.0)
MCV: 95.4 fL (ref 80.0–100.0)
Monocytes Absolute: 0.4 10*3/uL (ref 0.1–1.0)
Monocytes Relative: 7 %
Neutro Abs: 2.7 10*3/uL (ref 1.7–7.7)
Neutrophils Relative %: 50 %
Platelets: 311 10*3/uL (ref 150–400)
RBC: 4.55 MIL/uL (ref 3.87–5.11)
RDW: 12.4 % (ref 11.5–15.5)
WBC: 5.4 10*3/uL (ref 4.0–10.5)
nRBC: 0 % (ref 0.0–0.2)

## 2020-11-24 LAB — URINALYSIS, ROUTINE W REFLEX MICROSCOPIC
Glucose, UA: NEGATIVE mg/dL
Hgb urine dipstick: NEGATIVE
Ketones, ur: 5 mg/dL — AB
Nitrite: NEGATIVE
Protein, ur: 100 mg/dL — AB
Specific Gravity, Urine: 1.036 — ABNORMAL HIGH (ref 1.005–1.030)
WBC, UA: >50 WBC/hpf — ABNORMAL HIGH (ref 0–5)
pH: 5 (ref 5.0–8.0)

## 2020-11-24 LAB — WET PREP, GENITAL
Clue Cells Wet Prep HPF POC: NONE SEEN
Sperm: NONE SEEN
Yeast Wet Prep HPF POC: NONE SEEN

## 2020-11-24 LAB — HIV ANTIBODY (ROUTINE TESTING W REFLEX): HIV Screen 4th Generation wRfx: NONREACTIVE

## 2020-11-24 LAB — I-STAT BETA HCG BLOOD, ED (MC, WL, AP ONLY): I-stat hCG, quantitative: <5 m[IU]/mL (ref <5)

## 2020-11-24 MED ORDER — DOXYCYCLINE HYCLATE 100 MG PO CAPS: 100.0000 mg | ORAL_CAPSULE | Freq: Two times a day (BID) | ORAL | 0 refills | Status: AC

## 2020-11-24 MED ORDER — LIDOCAINE HCL (PF) 1 % IJ SOLN
1.0000 mL | Freq: Once | INTRAMUSCULAR | Status: AC
  Administered 2020-11-24: 1 mL
  Filled 2020-11-24: qty 5

## 2020-11-24 MED ORDER — DOXYCYCLINE HYCLATE 100 MG PO TABS
100.0000 mg | ORAL_TABLET | Freq: Once | ORAL | Status: AC
  Administered 2020-11-24: 100 mg via ORAL
  Filled 2020-11-24: qty 1

## 2020-11-24 MED ORDER — CEFTRIAXONE SODIUM 500 MG IJ SOLR
500.0000 mg | Freq: Once | INTRAMUSCULAR | Status: AC
  Administered 2020-11-24: 500 mg via INTRAMUSCULAR
  Filled 2020-11-24: qty 500

## 2020-11-24 MED ORDER — ONDANSETRON 4 MG PO TBDP
4.0000 mg | ORAL_TABLET | Freq: Once | ORAL | Status: AC
  Administered 2020-11-24: 4 mg via ORAL
  Filled 2020-11-24: qty 1

## 2020-11-25 LAB — GC/CHLAMYDIA PROBE AMP (~~LOC~~) NOT AT ARMC
Chlamydia: NEGATIVE
Comment: NEGATIVE
Comment: NORMAL
Neisseria Gonorrhea: POSITIVE — AB

## 2020-11-25 LAB — RPR: RPR Ser Ql: NONREACTIVE

## 2020-12-20 ENCOUNTER — Ambulatory Visit: Payer: Medicaid Other

## 2020-12-27 ENCOUNTER — Encounter (HOSPITAL_COMMUNITY): Payer: Self-pay

## 2020-12-27 ENCOUNTER — Emergency Department (HOSPITAL_COMMUNITY): Payer: Medicaid Other

## 2020-12-27 ENCOUNTER — Emergency Department (HOSPITAL_COMMUNITY)
Admission: EM | Admit: 2020-12-27 | Discharge: 2020-12-27 | Disposition: A | Discharge status: Home / Self Care | Payer: Medicaid Other | Attending: Emergency Medicine | Admitting: Emergency Medicine

## 2020-12-27 DIAGNOSIS — S20412A Abrasion of left back wall of thorax, initial encounter: Secondary | ICD-10-CM | POA: Diagnosis not present

## 2020-12-27 DIAGNOSIS — M79643 Pain in unspecified hand: Secondary | ICD-10-CM | POA: Diagnosis not present

## 2020-12-27 DIAGNOSIS — Y92410 Unspecified street and highway as the place of occurrence of the external cause: Secondary | ICD-10-CM | POA: Diagnosis not present

## 2020-12-27 DIAGNOSIS — R42 Dizziness and giddiness: Secondary | ICD-10-CM | POA: Diagnosis not present

## 2020-12-27 DIAGNOSIS — M79641 Pain in right hand: Secondary | ICD-10-CM | POA: Insufficient documentation

## 2020-12-27 DIAGNOSIS — S299XXA Unspecified injury of thorax, initial encounter: Secondary | ICD-10-CM | POA: Diagnosis present

## 2020-12-27 DIAGNOSIS — R0781 Pleurodynia: Secondary | ICD-10-CM | POA: Diagnosis not present

## 2020-12-27 DIAGNOSIS — M79642 Pain in left hand: Secondary | ICD-10-CM | POA: Diagnosis not present

## 2020-12-27 DIAGNOSIS — M25531 Pain in right wrist: Secondary | ICD-10-CM | POA: Diagnosis not present

## 2020-12-27 MED ORDER — NAPROXEN 500 MG PO TABS
500.0000 mg | ORAL_TABLET | Freq: Once | ORAL | Status: AC
  Administered 2020-12-27: 500 mg via ORAL
  Filled 2020-12-27: qty 1

## 2020-12-27 MED ORDER — METHOCARBAMOL 500 MG PO TABS: 500.0000 mg | ORAL_TABLET | Freq: Two times a day (BID) | ORAL | 0 refills | Status: AC

## 2020-12-27 MED ORDER — NAPROXEN 375 MG PO TABS: 375.0000 mg | ORAL_TABLET | Freq: Two times a day (BID) | ORAL | 0 refills | Status: AC
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
## 2020-12-27 NOTE — Progress Notes (Signed)
Orthopedic Tech Progress Note Patient Details:  Stacy Sellers 02/05/2001 [id number]  Ortho Devices Ortho Device/Splint Location: velcro wrist splint RUE Ortho Device/Splint Interventions: Ordered,Application   Post Interventions Patient Tolerated: Well Instructions Provided: Care of device   Jennye Moccasin 12/27/2020, 6:32 PM

## 2020-12-27 NOTE — Discharge Instructions (Addendum)
I have prescribed a short course of anti-inflammatories to help with your pain.  Please take 1 tablet twice a day to help with your pain.  I have also prescribed a short course of muscle relaxers, this should help with any muscle spasms, be aware this medication can make you drowsy, do not drink or drive while taking this medication.

## 2020-12-28 ENCOUNTER — Telehealth: Payer: Self-pay | Admitting: *Deleted

## 2020-12-28 NOTE — Telephone Encounter (Signed)
Transition Care Management Unsuccessful Follow-up Telephone Call  Date of discharge and from where: 12-27-2020  [facility]   Attempts:  1st  Reason for unsuccessful TCM follow-up call:   No answer/LM

## 2020-12-29 ENCOUNTER — Telehealth: Payer: Self-pay | Admitting: *Deleted

## 2020-12-29 ENCOUNTER — Ambulatory Visit: Payer: Self-pay

## 2020-12-29 NOTE — Telephone Encounter (Signed)
Transition Care Management Unsuccessful Follow-up Telephone Call  Date of discharge and from where: 12-27-2020  [facility]   Attempts:  2nd   Reason for unsuccessful TCM follow-up call:  No answer

## 2020-12-30 ENCOUNTER — Telehealth: Payer: Self-pay | Admitting: *Deleted

## 2020-12-30 NOTE — Telephone Encounter (Signed)
Transition Care Management Unsuccessful Follow-up Telephone Call  Date of discharge and from where:  12/27/2020 [facility]  Attempts:  3rd Attempt  Reason for unsuccessful TCM follow-up call:  Left voice message
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
## 2022-02-26 ENCOUNTER — Ambulatory Visit (INDEPENDENT_AMBULATORY_CARE_PROVIDER_SITE_OTHER): Payer: Medicaid Other | Admitting: Emergency Medicine

## 2022-02-26 ENCOUNTER — Ambulatory Visit: Payer: Self-pay | Admitting: *Deleted

## 2022-02-26 ENCOUNTER — Other Ambulatory Visit: Payer: Self-pay

## 2022-02-26 ENCOUNTER — Encounter (HOSPITAL_COMMUNITY): Payer: Self-pay | Admitting: *Deleted

## 2022-02-26 ENCOUNTER — Ambulatory Visit (HOSPITAL_COMMUNITY)
Admission: EM | Admit: 2022-02-26 | Discharge: 2022-02-26 | Disposition: A | Discharge status: Home / Self Care | Payer: Medicaid Other | Attending: Family Medicine | Admitting: Family Medicine

## 2022-02-26 VITALS — BP 128/86 | HR 88 | Ht 63.0 in | Wt 134.0 lb

## 2022-02-26 DIAGNOSIS — N920 Excessive and frequent menstruation with regular cycle: Secondary | ICD-10-CM

## 2022-02-26 DIAGNOSIS — N939 Abnormal uterine and vaginal bleeding, unspecified: Secondary | ICD-10-CM | POA: Diagnosis not present

## 2022-02-26 LAB — POCT URINE PREGNANCY: Preg Test, Ur: NEGATIVE

## 2022-02-26 LAB — POC URINE PREG, ED: Preg Test, Ur: NEGATIVE

## 2022-02-26 LAB — HCG, SERUM, QUALITATIVE: Preg, Serum: NEGATIVE

## 2022-02-26 NOTE — Telephone Encounter (Signed)
?  Chief Complaint: vaginal bleeding , unsure if pregnant ?Symptoms: spotting brownish mucus approx. 02/23/22 then bright pink and red on and off. Low abdominal pain pressure, low back pain, frequent urination. Dizziness lightheaded chills, possible fever, no temp reported. No appetite for several days. Took 2 pregnancy tests and one positive and one negative.  ?Frequency: since 02/23/22 missed period  ?Pertinent Negatives: Patient denies vaginal bleeding with clotting, no vomiting ?Disposition: [x] ED /[] Urgent Care (no appt availability in office) / [] Appointment(In office/virtual)/ []  [facility]/ [] Home Care/ [] Refused Recommended Disposition /[] [place]/ []  Follow-up with PCP ?Additional Notes: na ? ? Reason for Disposition ? [1] Constant abdominal pain AND [2] present > 2 hours ? ?Answer Assessment - Initial Assessment Questions ?1. ONSET: "When did this bleeding start?"   ?    02/23/22 ?2. DESCRIPTION: "Describe the bleeding that you are having." "How much bleeding is there?"  ?  - SPOTTING: spotting, or pinkish / brownish mucous discharge; does not fill panty liner or pad  ?  - MILD:  less than 1 pad / hour; less than patient's usual menstrual bleeding ?  - MODERATE: 1-2 pads / hour; 1 menstrual cup every 6 hours; small-medium blood clots (e.g., pea, grape, small coin) ?  - SEVERE: soaking 2 or more pads/hour for 2 or more hours; 1 menstrual cup every 2 hours; bleeding not contained by pads or continuous red blood from vagina; large blood clots (e.g., golf ball, large coin)  ?    Spotting brownish mucus now bright pinkish red color  ?3. ABDOMINAL PAIN SEVERITY: If present, ask: "How bad is it?"  (e.g., Scale 1-10; mild, moderate, or severe) ?  - MILD (1-3): doesn't interfere with normal activities, abdomen soft and not tender to touch  ?  - MODERATE (4-7): interferes with normal activities or awakens from sleep, abdomen tender to touch  ?  - SEVERE (8-10): excruciating pain, doubled  over, unable to do any normal activities ?    Low abdominal pain and pressure  with low back pain  ?4. PREGNANCY: "Do you know how many weeks or months pregnant you are?" "When was the first day of your last normal menstrual period?" ?    Not sure one test reports positive for pregnancy and the other was negative  ?5. HEMODYNAMIC STATUS: "Are you weak or feeling lightheaded?" If Yes, ask: "Can you stand and walk normally?"  ?    Dizziness, lightheadedness no appetite  ?6. OTHER SYMPTOMS: "What other symptoms are you having with the bleeding?" (e.g., passed tissue, vaginal discharge, fever, menstrual-type cramps) ?    Chills fever unknown temp. Frequency urinating, nausea ? ?Protocols used: Pregnancy - Vaginal Bleeding Less Than [redacted] Weeks EGA-A-AH ? ?

## 2022-02-26 NOTE — ED Provider Notes (Signed)
?[facility] ? ? ? ?CSN: [id number] ?Arrival date & time: 02/26/22  1609 ? ? ?  ? ?History   ?Chief Complaint ?Chief Complaint  ?Patient presents with  ? Possible Pregnancy  ? ? ?HPI ?Stacy Sellers is a 21 y.o. female.  ? ? ?Possible Pregnancy ? ?Here for irregular bleeding. ? ?Last menses was actually the 27th or 28 March. ? ?He is concerned because she states she has had only some bright red spotting that began 2 or 3 days ago (which might of been when her period was due), and she states she sees striae and lines like when she has been pregnant before on her abdomen. ? ? ?Then she states she also has had some fever and some abdominal pain and she had white discharge before the period began ? ?Past Medical History:  ?Diagnosis Date  ? Medical history non-contributory   ? ? ?Patient Active Problem List  ? Diagnosis Date Noted  ? Carpal tunnel syndrome, bilateral upper limbs 08/24/2019  ? Dysmenorrhea 08/24/2019  ? Encounter for initial prescription of contraceptive pills 06/09/2019  ? Postpartum depression 06/09/2019  ? History of premature delivery 12/30/2018  ? ? ?Past Surgical History:  ?Procedure Laterality Date  ? NO PAST SURGERIES    ? ? ?OB History   ? ? Gravida  ?2  ? Para  ?2  ? Term  ?1  ? Preterm  ?1  ? AB  ?   ? Living  ?2  ?  ? ? SAB  ?   ? IAB  ?   ? Ectopic  ?   ? Multiple  ?0  ? Live Births  ?2  ?   ?  ?  ? ? ? ?Home Medications   ? ?Prior to Admission medications   ?Medication Sig Start Date End Date Taking? Authorizing Provider  ?levonorgestrel-ethinyl estradiol (NORDETTE) 0.15-30 MG-MCG tablet Take 1 tablet by mouth daily. ?Patient not taking: Reported on 11/12/2019 06/09/19   Currie Paris, NP  ? ? ?Family History ?Family History  ?Problem Relation Age of Onset  ? Hypertension Maternal Grandmother   ? ? ?Social History ?Social History  ? ?Tobacco Use  ? Smoking status: Never  ? Smokeless tobacco: Never  ?Substance Use Topics  ? Alcohol use: No  ? Drug use: No  ? ? ? ?Allergies   ?Patient  has no known allergies. ? ? ?Review of Systems ?Review of Systems ? ? ?Physical Exam ?Triage Vital Signs ?ED Triage Vitals  ?Enc Vitals Group  ?   BP 02/26/22 1706 115/63  ?   Pulse Rate 02/26/22 1706 90  ?   Resp 02/26/22 1706 18  ?   Temp 02/26/22 1706 98.6 ?F (37 ?C)  ?   Temp src --   ?   SpO2 02/26/22 1706 98 %  ?   Weight --   ?   Height --   ?   Head Circumference --   ?   Peak Flow --   ?   Pain Score 02/26/22 1705 0  ?   Pain Loc --   ?   Pain Edu? --   ?   Excl. in GC? --   ? ?No data found. ? ?Updated Vital Signs ?BP 115/63   Pulse 90   Temp 98.6 ?F (37 ?C)   Resp 18   LMP 01/22/2022 (Approximate)   SpO2 98%  ? ?Visual Acuity ?Right Eye Distance:   ?Left Eye Distance:   ?Bilateral  Distance:   ? ?Right Eye Near:   ?Left Eye Near:    ?Bilateral Near:    ? ?Physical Exam ?Vitals reviewed.  ?Constitutional:   ?   General: She is not in acute distress. ?   Appearance: She is not ill-appearing, toxic-appearing or diaphoretic.  ?HENT:  ?   Right Ear: Tympanic membrane normal.  ?   Left Ear: Tympanic membrane normal.  ?   Mouth/Throat:  ?   Mouth: Mucous membranes are moist.  ?Eyes:  ?   Extraocular Movements: Extraocular movements intact.  ?   Pupils: Pupils are equal, round, and reactive to light.  ?Cardiovascular:  ?   Rate and Rhythm: Normal rate and regular rhythm.  ?   Heart sounds: No murmur heard. ?Pulmonary:  ?   Breath sounds: Normal breath sounds.  ?Abdominal:  ?   Palpations: Abdomen is soft.  ?   Tenderness: There is no abdominal tenderness.  ?Musculoskeletal:  ?   Cervical back: Neck supple.  ?Lymphadenopathy:  ?   Cervical: No cervical adenopathy.  ?Skin: ?   Coloration: Skin is not jaundiced or pale.  ?   Comments: She shows me striae that are vertical on her bilateral flanks.  ?Neurological:  ?   General: No focal deficit present.  ?   Mental Status: She is oriented to person, place, and time.  ?Psychiatric:     ?   Behavior: Behavior normal.  ? ? ? ?UC Treatments / Results  ?Labs ?(all labs  ordered are listed, but only abnormal results are displayed) ?Labs Reviewed  ?HCG, SERUM, QUALITATIVE  ?POC URINE PREG, ED  ?CERVICOVAGINAL ANCILLARY ONLY  ? ? ?EKG ? ? ?Radiology ?No results found. ? ?Procedures ?Procedures (including critical care time) ? ?Medications Ordered in UC ?Medications - No data to display ? ?Initial Impression / Assessment and Plan / UC Course  ?I have reviewed the triage vital signs and the nursing notes. ? ?Pertinent labs & imaging results that were available during my care of the patient were reviewed by me and considered in my medical decision making (see chart for details). ? ?  ? ?UPT is negative.  We will do a self swab for STDs, and staff will notify her and treat per protocol if anything is positive. ? ?Also because the pain is so concerned that she is really pregnant, a serum qualitative pregnancy test is also done.  We will notify her if that is positive ?Final Clinical Impressions(s) / UC Diagnoses  ? ?Final diagnoses:  ?Abnormal uterine bleeding  ? ? ? ?Discharge Instructions   ? ?  ?The urine pregnancy test was negative.  We have drawn blood for a serum pregnancy test.  Staff will notify you if it is positive.  You could download the MyChart and see the results either way. ? ?Staff will also call you if there is anything positive on the swab to be treated ? ? ? ? ?ED Prescriptions   ?None ?  ? ?PDMP not reviewed this encounter. ?  ?Zenia Resides, MD ?02/26/22 1744 ? ?

## 2022-02-26 NOTE — Discharge Instructions (Addendum)
The urine pregnancy test was negative.  We have drawn blood for a serum pregnancy test.  Staff will notify you if it is positive.  You could download the MyChart and see the results either way. ? ?Staff will also call you if there is anything positive on the swab to be treated ?

## 2022-02-26 NOTE — ED Triage Notes (Signed)
T requesting a pregnancy test. ?

## 2022-02-26 NOTE — Progress Notes (Signed)
Ms. Stacy S[facility] presents today for UPT. She has complaints of headache, abdominal pain in the lower midline, nausea without vomiting for 7 days, and backache. ? ?LMP: 02/24/22 ?   ? ?OBJECTIVE: Appears well, in no apparent distress.  ? ?Home UPT Result: positive, per patient ? ?In-Office UPT result:NEGATIVE ? ?I have reviewed the patient's medical, obstetrical, social, and family histories, and medications.  ? ?ASSESSMENT: Negative pregnancy test ? ?PLAN: Pt states that she doesn't believe that her current period is a real period and that it could be bleeding from a pregnancy. This RN reassured patient that UPT was performed twice on separate samples. Pt advised to schedule apt to return to office to discuss negative UPT & symptoms with provider. Pt agreed. ? ?  ?

## 2022-02-27 LAB — CERVICOVAGINAL ANCILLARY ONLY
Bacterial Vaginitis (gardnerella): NEGATIVE
Candida Glabrata: NEGATIVE
Candida Vaginitis: NEGATIVE
Chlamydia: NEGATIVE
Comment: NEGATIVE
Comment: NEGATIVE
Comment: NEGATIVE
Comment: NEGATIVE
Comment: NEGATIVE
Comment: NORMAL
Neisseria Gonorrhea: NEGATIVE
Trichomonas: NEGATIVE

## 2022-03-02 ENCOUNTER — Encounter (HOSPITAL_COMMUNITY): Payer: Self-pay

## 2022-03-05 ENCOUNTER — Other Ambulatory Visit: Payer: Medicaid Other

## 2022-07-25 ENCOUNTER — Other Ambulatory Visit: Payer: Self-pay

## 2022-07-25 ENCOUNTER — Emergency Department (HOSPITAL_COMMUNITY): Payer: Medicaid Other

## 2022-07-25 ENCOUNTER — Emergency Department (HOSPITAL_COMMUNITY)
Admission: EM | Admit: 2022-07-25 | Discharge: 2022-07-25 | Disposition: A | Discharge status: Home / Self Care | Payer: Medicaid Other | Attending: Emergency Medicine | Admitting: Emergency Medicine

## 2022-07-25 DIAGNOSIS — S43401A Unspecified sprain of right shoulder joint, initial encounter: Secondary | ICD-10-CM

## 2022-07-25 DIAGNOSIS — Z041 Encounter for examination and observation following transport accident: Secondary | ICD-10-CM | POA: Diagnosis not present

## 2022-07-25 DIAGNOSIS — R200 Anesthesia of skin: Secondary | ICD-10-CM | POA: Diagnosis not present

## 2022-07-25 DIAGNOSIS — S63501A Unspecified sprain of right wrist, initial encounter: Secondary | ICD-10-CM | POA: Diagnosis not present

## 2022-07-25 DIAGNOSIS — S6991XA Unspecified injury of right wrist, hand and finger(s), initial encounter: Secondary | ICD-10-CM | POA: Diagnosis present

## 2022-07-25 DIAGNOSIS — Y92410 Unspecified street and highway as the place of occurrence of the external cause: Secondary | ICD-10-CM | POA: Insufficient documentation
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
## 2022-07-25 NOTE — Discharge Instructions (Signed)
You are being given a wrist brace for comfort.  Use rest, ice, elevation for your wrist and shoulder.  If you feel like your symptoms or not improving, especially after a week or so, see the sports medicine/orthopedist listed.
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
## 2022-07-25 NOTE — Progress Notes (Signed)
Orthopedic Tech Progress Note Patient Details:  Stacy Sellers 04-21-2001 [id number]  Ortho Devices Type of Ortho Device: Velcro wrist splint Ortho Device/Splint Location: Right wrist Ortho Device/Splint Interventions: Application   Post Interventions Patient Tolerated: Well  Linus Salmons Sandford Diop 07/25/2022, 10:03 PM

## 2022-08-02 DIAGNOSIS — M25511 Pain in right shoulder: Secondary | ICD-10-CM | POA: Diagnosis not present

## 2022-08-07 ENCOUNTER — Ambulatory Visit: Payer: Medicaid Other

## 2022-11-01 ENCOUNTER — Ambulatory Visit: Payer: Self-pay

## 2022-11-06 ENCOUNTER — Ambulatory Visit: Payer: Self-pay

## 2023-08-29 ENCOUNTER — Encounter (HOSPITAL_COMMUNITY): Payer: Self-pay | Admitting: *Deleted

## 2024-01-25 ENCOUNTER — Emergency Department (HOSPITAL_COMMUNITY)
Admission: EM | Admit: 2024-01-25 | Discharge: 2024-01-25 | Disposition: A | Discharge status: Home / Self Care | Attending: Emergency Medicine | Admitting: Emergency Medicine

## 2024-01-25 ENCOUNTER — Encounter (HOSPITAL_COMMUNITY): Payer: Self-pay

## 2024-01-25 ENCOUNTER — Other Ambulatory Visit: Payer: Self-pay

## 2024-01-25 ENCOUNTER — Emergency Department (HOSPITAL_COMMUNITY)

## 2024-01-25 DIAGNOSIS — Y92410 Unspecified street and highway as the place of occurrence of the external cause: Secondary | ICD-10-CM | POA: Diagnosis not present

## 2024-01-25 DIAGNOSIS — S0081XA Abrasion of other part of head, initial encounter: Secondary | ICD-10-CM | POA: Diagnosis not present

## 2024-01-25 DIAGNOSIS — S8992XA Unspecified injury of left lower leg, initial encounter: Secondary | ICD-10-CM | POA: Diagnosis not present

## 2024-01-25 DIAGNOSIS — M79662 Pain in left lower leg: Secondary | ICD-10-CM | POA: Insufficient documentation

## 2024-01-25 DIAGNOSIS — M79605 Pain in left leg: Secondary | ICD-10-CM | POA: Diagnosis not present

## 2024-01-25 DIAGNOSIS — R9431 Abnormal electrocardiogram [ECG] [EKG]: Secondary | ICD-10-CM | POA: Diagnosis not present

## 2024-01-25 DIAGNOSIS — M25552 Pain in left hip: Secondary | ICD-10-CM | POA: Diagnosis not present

## 2024-01-25 DIAGNOSIS — R079 Chest pain, unspecified: Secondary | ICD-10-CM | POA: Diagnosis not present

## 2024-01-25 DIAGNOSIS — S0990XA Unspecified injury of head, initial encounter: Secondary | ICD-10-CM | POA: Diagnosis present

## 2024-01-25 LAB — COMPREHENSIVE METABOLIC PANEL WITH GFR
ALT: 12 U/L (ref 0–44)
AST: 17 U/L (ref 15–41)
Albumin: 4 g/dL (ref 3.5–5.0)
Alkaline Phosphatase: 56 U/L (ref 38–126)
Anion gap: 8 (ref 5–15)
BUN: 6 mg/dL (ref 6–20)
CO2: 23 mmol/L (ref 22–32)
Calcium: 9.8 mg/dL (ref 8.9–10.3)
Chloride: 107 mmol/L (ref 98–111)
Creatinine, Ser: 0.81 mg/dL (ref 0.44–1.00)
GFR, Estimated: >60 mL/min (ref >60)
Glucose, Bld: 94 mg/dL (ref 70–99)
Potassium: 3.3 mmol/L — ABNORMAL LOW (ref 3.5–5.1)
Sodium: 138 mmol/L (ref 135–145)
Total Bilirubin: 0.7 mg/dL (ref 0.0–1.2)
Total Protein: 7.2 g/dL (ref 6.5–8.1)

## 2024-01-25 LAB — CBC WITH DIFFERENTIAL/PLATELET
Abs Immature Granulocytes: 0.02 10*3/uL (ref 0.00–0.07)
Basophils Absolute: 0 10*3/uL (ref 0.0–0.1)
Basophils Relative: 0 %
Eosinophils Absolute: 0 10*3/uL (ref 0.0–0.5)
Eosinophils Relative: 0 %
HCT: 40.5 % (ref 36.0–46.0)
Hemoglobin: 13.4 g/dL (ref 12.0–15.0)
Immature Granulocytes: 0 %
Lymphocytes Relative: 23 %
Lymphs Abs: 1.6 10*3/uL (ref 0.7–4.0)
MCH: 32.1 pg (ref 26.0–34.0)
MCHC: 33.1 g/dL (ref 30.0–36.0)
MCV: 97.1 fL (ref 80.0–100.0)
Monocytes Absolute: 0.4 10*3/uL (ref 0.1–1.0)
Monocytes Relative: 5 %
Neutro Abs: 5.1 10*3/uL (ref 1.7–7.7)
Neutrophils Relative %: 72 %
Platelets: 250 10*3/uL (ref 150–400)
RBC: 4.17 MIL/uL (ref 3.87–5.11)
RDW: 12.8 % (ref 11.5–15.5)
WBC: 7.2 10*3/uL (ref 4.0–10.5)
nRBC: 0 % (ref 0.0–0.2)

## 2024-01-25 LAB — HCG, SERUM, QUALITATIVE: Preg, Serum: NEGATIVE

## 2024-01-25 LAB — SALICYLATE LEVEL: Salicylate Lvl: <7 mg/dL — ABNORMAL LOW (ref 7.0–30.0)

## 2024-01-25 LAB — ETHANOL: Alcohol, Ethyl (B): <10 mg/dL (ref <10)

## 2024-01-25 LAB — ACETAMINOPHEN LEVEL: Acetaminophen (Tylenol), Serum: <10 ug/mL — ABNORMAL LOW (ref 10–30)
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
## 2024-01-25 NOTE — Discharge Instructions (Addendum)
 Return for any problem.   Use ibuprofen / tylenol for pain.   [facility] - [address] [phone] - They are open 24 hours a day.
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
## 2024-05-15 ENCOUNTER — Encounter: Payer: Self-pay | Admitting: Advanced Practice Midwife

## 2024-09-13 ENCOUNTER — Encounter (HOSPITAL_COMMUNITY): Payer: Self-pay | Admitting: Emergency Medicine

## 2024-09-13 ENCOUNTER — Other Ambulatory Visit: Payer: Self-pay

## 2024-09-13 ENCOUNTER — Emergency Department (HOSPITAL_COMMUNITY)
Admission: EM | Admit: 2024-09-13 | Discharge: 2024-09-14 | Discharge status: Against Medical Advice | Attending: Emergency Medicine | Admitting: Emergency Medicine

## 2024-09-13 DIAGNOSIS — R059 Cough, unspecified: Secondary | ICD-10-CM | POA: Insufficient documentation

## 2024-09-13 DIAGNOSIS — Z5321 Procedure and treatment not carried out due to patient leaving prior to being seen by health care provider: Secondary | ICD-10-CM | POA: Diagnosis not present

## 2024-09-13 DIAGNOSIS — R0981 Nasal congestion: Secondary | ICD-10-CM | POA: Diagnosis not present

## 2024-09-13 DIAGNOSIS — J3489 Other specified disorders of nose and nasal sinuses: Secondary | ICD-10-CM | POA: Insufficient documentation

## 2024-09-13 DIAGNOSIS — N6310 Unspecified lump in the right breast, unspecified quadrant: Secondary | ICD-10-CM | POA: Diagnosis not present

## 2024-09-13 LAB — RESP PANEL BY RT-PCR (RSV, FLU A&B, COVID)  RVPGX2
Influenza A by PCR: NEGATIVE
Influenza B by PCR: NEGATIVE
Resp Syncytial Virus by PCR: NEGATIVE
SARS Coronavirus 2 by RT PCR: NEGATIVE

## 2024-09-13 NOTE — ED Triage Notes (Signed)
 Patient here with cough, congestion, hot flashes, along with lump around R breast x1 week. Endorses sick contacts.

## 2024-09-13 NOTE — ED Triage Notes (Signed)
 Pt reports cough, congestion & runny nose x 1 week. Also reports she saw a lump on her breast today.

## 2024-09-14 NOTE — ED Notes (Signed)
 Called X3 Pt for vitals no answer.

## 2024-09-16 ENCOUNTER — Other Ambulatory Visit: Payer: Self-pay

## 2024-09-16 ENCOUNTER — Emergency Department (HOSPITAL_COMMUNITY)

## 2024-09-16 ENCOUNTER — Emergency Department (HOSPITAL_COMMUNITY): Admission: EM | Admit: 2024-09-16 | Discharge: 2024-09-16 | Disposition: A | Discharge status: Home / Self Care

## 2024-09-16 DIAGNOSIS — Z0389 Encounter for observation for other suspected diseases and conditions ruled out: Secondary | ICD-10-CM | POA: Diagnosis not present

## 2024-09-16 DIAGNOSIS — N6311 Unspecified lump in the right breast, upper outer quadrant: Secondary | ICD-10-CM | POA: Insufficient documentation

## 2024-09-16 DIAGNOSIS — N6310 Unspecified lump in the right breast, unspecified quadrant: Secondary | ICD-10-CM | POA: Diagnosis not present

## 2024-09-16 LAB — BASIC METABOLIC PANEL WITH GFR
Anion gap: 9 (ref 5–15)
BUN: <5 mg/dL — ABNORMAL LOW (ref 6–20)
CO2: 25 mmol/L (ref 22–32)
Calcium: 9.2 mg/dL (ref 8.9–10.3)
Chloride: 105 mmol/L (ref 98–111)
Creatinine, Ser: 0.81 mg/dL (ref 0.44–1.00)
GFR, Estimated: >60 mL/min (ref >60)
Glucose, Bld: 77 mg/dL (ref 70–99)
Potassium: 3.7 mmol/L (ref 3.5–5.1)
Sodium: 139 mmol/L (ref 135–145)

## 2024-09-16 LAB — CBC
HCT: 40.2 % (ref 36.0–46.0)
Hemoglobin: 13 g/dL (ref 12.0–15.0)
MCH: 32 pg (ref 26.0–34.0)
MCHC: 32.3 g/dL (ref 30.0–36.0)
MCV: 99 fL (ref 80.0–100.0)
Platelets: 282 K/uL (ref 150–400)
RBC: 4.06 MIL/uL (ref 3.87–5.11)
RDW: 12 % (ref 11.5–15.5)
WBC: 6.2 K/uL (ref 4.0–10.5)
nRBC: 0 % (ref 0.0–0.2)

## 2024-09-16 LAB — PREGNANCY, URINE: Preg Test, Ur: NEGATIVE

## 2024-09-16 NOTE — Discharge Instructions (Signed)
 You should be contacted by the breast clinic for follow-up.  You will need an ultrasound.  Please follow-up with your primary doctor and your OB/GYN doctor, if you do not have 1 you may go to [email or web address] and click the find a provider link.  Return for fevers, chills, chest pain, shortness of breath, lightheadedness, passout or any new or worsening symptoms that are concerning to you.

## 2024-09-16 NOTE — ED Provider Notes (Signed)
 [place] EMERGENCY DEPARTMENT AT [facility] Provider Note   CSN: [id number] Arrival date & time: 09/16/24  9152     Patient presents with: No chief complaint on file.   Stacy Sellers is a 23 y.o. female.   Is a 23 year old female presenting emergency department with breast mass.  Present x 1 year or slightly larger over the past month or 2.  Seems to fluctuate in size near her menses.  No constitutional symptoms.  No chest pain or shortness of breath        Prior to Admission medications   Not on File    Allergies: Patient has no known allergies.    Review of Systems  Updated Vital Signs BP (!) 135/98 (BP Location: Right Arm)   Pulse 96   Temp 97.7 F (36.5 C) (Oral)   Resp 15   Ht 5' 4 (1.626 m)   Wt 72.6 kg   LMP 08/16/2024 (Approximate)   SpO2 100%   BMI 27.46 kg/m   Physical Exam Vitals and nursing note reviewed. Exam conducted with a chaperone present.  Constitutional:      General: She is not in acute distress.    Appearance: She is not toxic-appearing.  HENT:     Head: Normocephalic.     Nose: Nose normal.     Mouth/Throat:     Mouth: Mucous membranes are moist.  Eyes:     Conjunctiva/sclera: Conjunctivae normal.  Cardiovascular:     Rate and Rhythm: Normal rate and regular rhythm.  Pulmonary:     Effort: Pulmonary effort is normal.     Breath sounds: Normal breath sounds.  Abdominal:     General: Abdomen is flat. Bowel sounds are normal.     Palpations: Abdomen is soft.  Musculoskeletal:     Comments: Patient with a roughly 3 x 4 cm mass in her upper outer quadrant of her breast extending up towards her axilla.  Skin:    General: Skin is warm and dry.     Capillary Refill: Capillary refill takes less than 2 seconds.  Neurological:     Mental Status: She is alert and oriented to person, place, and time.  Psychiatric:        Mood and Affect: Mood normal.        Behavior: Behavior normal.     (all labs ordered are listed,  but only abnormal results are displayed) Labs Reviewed  BASIC METABOLIC PANEL WITH GFR - Abnormal; Notable for the following components:      Result Value   BUN <5 (*)    All other components within normal limits  CBC  PREGNANCY, URINE    EKG: None  Radiology: DG Chest 2 View Result Date: 09/16/2024 EXAM: 2 VIEW(S) XRAY OF THE CHEST 09/16/2024 10:32:00 AM COMPARISON: 01/25/2024 CLINICAL HISTORY: breast mass FINDINGS: LUNGS AND PLEURA: No focal pulmonary opacity. No pleural effusion. No pneumothorax. HEART AND MEDIASTINUM: No acute abnormality of the cardiac and mediastinal silhouettes. BONES AND SOFT TISSUES: No acute osseous abnormality. IMPRESSION: 1. No acute cardiopulmonary disease. Electronically signed by: Lynwood Seip MD 09/16/2024 10:47 AM EST RP Workstation: [id number]     Procedures   Medications Ordered in the ED - No data to display                                  Medical Decision Making Well-appearing 23 year old presents with breast mass.  Afebrile  nontachycardic, hemodynamically stable.  Physical exam does demonstrate a palpable mass in her upper breast.  Basic labs without leukocytosis to suggest infectious process.  No significant metabolic derangements.  Chest x-ray without overt mass or abnormality.  I discussed my concern with patient regarding this mass that it could be cancer and that she needs follow-up.  I do not have the ability to ultrasound breast here in the emergency department today.  However given my concern ordered outpatient ultrasound and referred to breast clinic.  I also encouraged her to follow-up with, unfortunately she does not have 1.  Discussed the find a provider function on [email or web address].  No overt emergent condition identified today stable for discharge.  Amount and/or Complexity of Data Reviewed Labs: ordered. Radiology: ordered.       Final diagnoses:  Mass of upper outer quadrant of right breast    ED Discharge Orders           Ordered    US  LIMITED ULTRASOUND INCLUDING AXILLA RIGHT BREAST        09/16/24 1340    Ambulatory referral to [facility] Breast [facility]        09/16/24 1340               Neysa Caron PARAS, [place] 09/16/24 1345

## 2024-09-16 NOTE — ED Triage Notes (Addendum)
 Patient arrives POV for lump on outside of right breast. Patient endorses swelling to lump around menstruation. Patient endorses 8/10 constant pain to right breast around site. Patient endorses this is the first time she has been seen for same. Patient endorses start of LMP 10/19.

## 2024-09-16 NOTE — ED Provider Triage Note (Addendum)
 Emergency Medicine Provider Triage Evaluation Note  Stacy Sellers , a 23 y.o. female  was evaluated in triage.  Pt complains of mass to her right breast/axilla.  Has been present for close to a year has been growing in size and fluctuating around menses.  Has not acutely changed or worsened.  Review of Systems  Positive: Mass Negative: Chest pain shortness of breath  Physical Exam  BP (!) 135/98 (BP Location: Right Arm)   Pulse 96   Temp 97.7 F (36.5 C) (Oral)   Resp 15   Ht 5' 4 (1.626 m)   Wt 72.6 kg   LMP 08/16/2024 (Approximate)   SpO2 100%   BMI 27.46 kg/m  Gen:   Awake, no distress   Resp:  Normal effort  MSK:   Moves extremities without difficulty  Other:  Right breast mass in the upper right quadrant and towards axilla.  Medical Decision Making  Medically screening exam initiated at 9:39 AM.  Appropriate orders placed.  Stacy Sellers was informed that the remainder of the evaluation will be completed by another provider, this initial triage assessment does not replace that evaluation, and the importance of remaining in the ED until their evaluation is complete.  23 year old with no PCP or OB/GYN presents with breast mass.  Present x 1 year, but growing in size more recently.  Will get screening labs and ultrasound.  Was told by staff we do not do Ultrasounds in the ED.    Neysa Caron PARAS, DO 09/16/24 9060    Neysa Caron PARAS, DO 09/16/24 1009

## 2024-10-01 ENCOUNTER — Ambulatory Visit: Admitting: Obstetrics

## 2024-10-12 ENCOUNTER — Ambulatory Visit: Admitting: Obstetrics

## 2024-11-04 ENCOUNTER — Other Ambulatory Visit (HOSPITAL_COMMUNITY)
Admission: RE | Admit: 2024-11-04 | Discharge: 2024-11-04 | Disposition: A | Discharge status: Home / Self Care | Source: Ambulatory Visit | Attending: Obstetrics and Gynecology | Admitting: Obstetrics and Gynecology

## 2024-11-04 ENCOUNTER — Encounter: Payer: Self-pay | Admitting: Obstetrics and Gynecology

## 2024-11-04 ENCOUNTER — Ambulatory Visit: Admitting: Obstetrics and Gynecology

## 2024-11-04 VITALS — BP 119/78 | HR 84 | Ht 63.0 in | Wt 138.0 lb

## 2024-11-04 DIAGNOSIS — N63 Unspecified lump in unspecified breast: Secondary | ICD-10-CM

## 2024-11-04 DIAGNOSIS — Z01419 Encounter for gynecological examination (general) (routine) without abnormal findings: Secondary | ICD-10-CM

## 2024-11-04 DIAGNOSIS — Z113 Encounter for screening for infections with a predominantly sexual mode of transmission: Secondary | ICD-10-CM

## 2024-11-04 DIAGNOSIS — Z01411 Encounter for gynecological examination (general) (routine) with abnormal findings: Secondary | ICD-10-CM

## 2024-11-04 NOTE — Progress Notes (Signed)
 "   GYNECOLOGY ANNUAL PREVENTATIVE CARE ENCOUNTER NOTE  History:     Stacy Sellers is a 24 y.o. [id number] female here for a routine annual gynecologic exam.  Current complaints: right breast mass for about a year.   Denies abnormal vaginal bleeding, discharge, pelvic pain, problems with intercourse or other gynecologic concerns.    Gynecologic History Patient's last menstrual period was 10/15/2024 (exact date). Contraception: none Last Pap: unknown Last mammogram: n/a  Obstetric History OB History  Gravida Para Term Preterm AB Living  2 2 1 1  0 2  SAB IAB Ectopic Multiple Live Births  0 0 0  2    # Outcome Date GA Lbr Len/2nd Weight Sex Type Anes PTL Lv  2 Term 04/25/19 [redacted]w[redacted]d 02:35 / 00:31 7 lb 12.2 oz (3.52 kg) M Vag-Spont None  LIV  1 Preterm 05/26/15 [redacted]w[redacted]d 05:35 / 00:05 5 lb 8.2 oz (2.5 kg) F Vag-Spont None  LIV    Past Medical History:  Diagnosis Date   Depression    Medical history non-contributory     Past Surgical History:  Procedure Laterality Date   NO PAST SURGERIES      Medications Ordered Prior to Encounter[1]  Allergies[2]  Social History:  reports that she has been smoking cigarettes. She has never used smokeless tobacco. She reports current alcohol use. She reports that she does not use drugs.  Family History  Problem Relation Age of Onset   Hypertension Maternal Grandmother     The following portions of the patient's history were reviewed and updated as appropriate: allergies, current medications, past family history, past medical history, past social history, past surgical history and problem list.  Review of Systems Pertinent items noted in HPI and remainder of comprehensive ROS otherwise negative.  Physical Exam:  BP 119/78 (BP Location: Right Arm, Cuff Size: Normal)   Pulse 84   Ht 5' 3 (1.6 m)   Wt 138 lb (62.6 kg)   LMP 10/15/2024 (Exact Date)   BMI 24.45 kg/m  CONSTITUTIONAL: Well-developed, well-nourished female in no acute distress.   HENT:  Normocephalic, atraumatic, External right and left ear normal. Oropharynx is clear and moist EYES: Conjunctivae and EOM are normal.  NECK: Normal range of motion, supple, no masses.  Normal thyroid.  SKIN: Skin is warm and dry. No rash noted. Not diaphoretic. No erythema. No pallor. MUSCULOSKELETAL: Normal range of motion. No tenderness.  No cyanosis, clubbing, or edema.  2+ distal pulses. NEUROLOGIC: Alert and oriented to person, place, and time. Normal reflexes, muscle tone coordination.  PSYCHIATRIC: Normal mood and affect. Normal behavior. Normal judgment and thought content. CARDIOVASCULAR: Normal heart rate noted, regular rhythm RESPIRATORY: Clear to auscultation bilaterally. Effort and breath sounds normal, no problems with respiration noted. BREASTS: Symmetric in size. Palpable right breast mass 1-2 cm detected at 10-11 o'clock. Performed in the presence of a chaperone. ABDOMEN: Soft, no distention noted.  No tenderness, rebound or guarding.  PELVIC: Normal appearing external genitalia and urethral meatus; normal appearing vaginal mucosa and cervix.  No abnormal discharge noted.  Pap smear obtained. Vaginal swab taken.   Normal uterine size, no other palpable masses, no uterine or adnexal tenderness.  Performed in the presence of a chaperone.   Assessment and Plan:    1. Women's annual routine gynecological examination (Primary) Normal annual exam - Cytology - PAP( [place])  2. Routine screening for STI (sexually transmitted infection) Per pt request - Cervicovaginal ancillary only( [place])  3. Breast mass in  female Palpable right breast mass noted Will order breast ultrasound - US  LIMITED ULTRASOUND INCLUDING AXILLA RIGHT BREAST; Future  Will follow up results of pap smear and manage accordingly. Breast ultrasound scheduled Routine preventative health maintenance measures emphasized. Please refer to After Visit Summary for other counseling recommendations.       Jerilynn Buddle, MD, FACOG Obstetrician & Gynecologist, [facility] for [facility], [facility]      [1]  No current outpatient medications on file prior to visit.   No current facility-administered medications on file prior to visit.  [2] No Known Allergies  "

## 2024-11-04 NOTE — Progress Notes (Signed)
 24 y.o. New GYN presents for AEX/PAP.  C/o R breast lump, tender to touch, pain 8/10 x 1 year

## 2024-11-05 LAB — CERVICOVAGINAL ANCILLARY ONLY
Bacterial Vaginitis (gardnerella): POSITIVE — AB
Candida Glabrata: NEGATIVE
Candida Vaginitis: POSITIVE — AB
Chlamydia: NEGATIVE
Comment: NEGATIVE
Comment: NEGATIVE
Comment: NEGATIVE
Comment: NEGATIVE
Comment: NEGATIVE
Comment: NORMAL
Neisseria Gonorrhea: NEGATIVE
Trichomonas: NEGATIVE

## 2024-11-06 ENCOUNTER — Emergency Department (HOSPITAL_COMMUNITY)
Admission: EM | Admit: 2024-11-06 | Discharge: 2024-11-06 | Disposition: A | Discharge status: Home / Self Care | Attending: Emergency Medicine | Admitting: Emergency Medicine

## 2024-11-06 ENCOUNTER — Other Ambulatory Visit: Payer: Self-pay

## 2024-11-06 ENCOUNTER — Encounter (HOSPITAL_COMMUNITY): Payer: Self-pay

## 2024-11-06 DIAGNOSIS — D72819 Decreased white blood cell count, unspecified: Secondary | ICD-10-CM | POA: Insufficient documentation

## 2024-11-06 DIAGNOSIS — K047 Periapical abscess without sinus: Secondary | ICD-10-CM | POA: Diagnosis not present

## 2024-11-06 DIAGNOSIS — K0889 Other specified disorders of teeth and supporting structures: Secondary | ICD-10-CM | POA: Diagnosis present

## 2024-11-06 LAB — URINALYSIS, W/ REFLEX TO CULTURE (INFECTION SUSPECTED)
Bacteria, UA: NONE SEEN
Bilirubin Urine: NEGATIVE
Glucose, UA: NEGATIVE mg/dL
Hgb urine dipstick: NEGATIVE
Ketones, ur: NEGATIVE mg/dL
Leukocytes,Ua: NEGATIVE
Nitrite: NEGATIVE
Protein, ur: NEGATIVE mg/dL
Specific Gravity, Urine: 1.02 (ref 1.005–1.030)
pH: 6.5 (ref 5.0–8.0)

## 2024-11-06 LAB — CBC WITH DIFFERENTIAL/PLATELET
Abs Immature Granulocytes: 0.01 K/uL (ref 0.00–0.07)
Basophils Absolute: 0 K/uL (ref 0.0–0.1)
Basophils Relative: 0 %
Eosinophils Absolute: 0 K/uL (ref 0.0–0.5)
Eosinophils Relative: 1 %
HCT: 41.8 % (ref 36.0–46.0)
Hemoglobin: 13.7 g/dL (ref 12.0–15.0)
Immature Granulocytes: 0 %
Lymphocytes Relative: 49 %
Lymphs Abs: 1.8 K/uL (ref 0.7–4.0)
MCH: 32 pg (ref 26.0–34.0)
MCHC: 32.8 g/dL (ref 30.0–36.0)
MCV: 97.7 fL (ref 80.0–100.0)
Monocytes Absolute: 0.3 K/uL (ref 0.1–1.0)
Monocytes Relative: 7 %
Neutro Abs: 1.6 K/uL — ABNORMAL LOW (ref 1.7–7.7)
Neutrophils Relative %: 43 %
Platelets: 196 K/uL (ref 150–400)
RBC: 4.28 MIL/uL (ref 3.87–5.11)
RDW: 12.9 % (ref 11.5–15.5)
WBC: 3.7 K/uL — ABNORMAL LOW (ref 4.0–10.5)
nRBC: 0 % (ref 0.0–0.2)

## 2024-11-06 LAB — COMPREHENSIVE METABOLIC PANEL WITH GFR
ALT: 8 U/L (ref 0–44)
AST: 16 U/L (ref 15–41)
Albumin: 4.1 g/dL (ref 3.5–5.0)
Alkaline Phosphatase: 61 U/L (ref 38–126)
Anion gap: 11 (ref 5–15)
BUN: 5 mg/dL — ABNORMAL LOW (ref 6–20)
CO2: 22 mmol/L (ref 22–32)
Calcium: 9.1 mg/dL (ref 8.9–10.3)
Chloride: 104 mmol/L (ref 98–111)
Creatinine, Ser: 0.69 mg/dL (ref 0.44–1.00)
GFR, Estimated: >60 mL/min
Glucose, Bld: 85 mg/dL (ref 70–99)
Potassium: 4.2 mmol/L (ref 3.5–5.1)
Sodium: 137 mmol/L (ref 135–145)
Total Bilirubin: 0.2 mg/dL (ref 0.0–1.2)
Total Protein: 7.1 g/dL (ref 6.5–8.1)

## 2024-11-06 LAB — I-STAT CG4 LACTIC ACID, ED: Lactic Acid, Venous: 0.6 mmol/L (ref 0.5–1.9)

## 2024-11-06 LAB — HCG, SERUM, QUALITATIVE: Preg, Serum: NEGATIVE

## 2024-11-06 MED ORDER — HYDROCODONE-ACETAMINOPHEN 5-325 MG PO TABS: 1.0000 | ORAL_TABLET | ORAL | 0 refills | Status: AC | PRN

## 2024-11-06 MED ORDER — IBUPROFEN 600 MG PO TABS: 600.0000 mg | ORAL_TABLET | Freq: Four times a day (QID) | ORAL | 0 refills | Status: AC | PRN

## 2024-11-06 MED ORDER — IBUPROFEN 400 MG PO TABS
600.0000 mg | ORAL_TABLET | Freq: Once | ORAL | Status: AC
  Administered 2024-11-06: 600 mg via ORAL
  Filled 2024-11-06: qty 1

## 2024-11-06 MED ORDER — AMOXICILLIN-POT CLAVULANATE 875-125 MG PO TABS: 1.0000 | ORAL_TABLET | Freq: Two times a day (BID) | ORAL | 0 refills | Status: AC

## 2024-11-06 MED ORDER — AMOXICILLIN-POT CLAVULANATE 875-125 MG PO TABS
1.0000 | ORAL_TABLET | Freq: Once | ORAL | Status: AC
  Administered 2024-11-06: 1 via ORAL
  Filled 2024-11-06: qty 1

## 2024-11-06 NOTE — ED Provider Notes (Signed)
 " [place] EMERGENCY DEPARTMENT AT [facility] Provider Note   CSN: [id number] Arrival date & time: 11/06/24  9185     Patient presents with: Dental Pain   Stacy Sellers is a 24 y.o. female.   Pt is a 24 yo female with no significant pmhx.  She said her tooth broke off about 5 months ago.  She started getting pain in her tooth a few days ago.  The pain is now severe.  She does not have a dentist.       Prior to Admission medications  Medication Sig Start Date End Date Taking? Authorizing Provider  amoxicillin -clavulanate (AUGMENTIN ) 875-125 MG tablet Take 1 tablet by mouth every 12 (twelve) hours. 11/06/24  Yes Dean Clarity, MD  HYDROcodone -acetaminophen  (NORCO/VICODIN) 5-325 MG tablet Take 1 tablet by mouth every 4 (four) hours as needed. 11/06/24  Yes Dean Clarity, MD  ibuprofen  (ADVIL ) 600 MG tablet Take 1 tablet (600 mg total) by mouth every 6 (six) hours as needed. 11/06/24  Yes Dean Clarity, MD    Allergies: Patient has no known allergies.    Review of Systems  HENT:  Positive for dental problem.   All other systems reviewed and are negative.   Updated Vital Signs BP 114/77   Pulse 89   Temp 98.1 F (36.7 C) (Oral)   Resp 16   Ht 5' 3 (1.6 m)   Wt 62.6 kg   LMP 10/15/2024 (Exact Date)   SpO2 100%   BMI 24.45 kg/m   Physical Exam Vitals and nursing note reviewed.  Constitutional:      Appearance: Normal appearance.  HENT:     Head: Normocephalic and atraumatic.     Comments: Broken tooth left lower jaw.  No abscess to drain.  No ludwig's.    Right Ear: External ear normal.     Left Ear: External ear normal.     Nose: Nose normal.     Mouth/Throat:     Mouth: Mucous membranes are moist.     Pharynx: Oropharynx is clear.  Eyes:     Extraocular Movements: Extraocular movements intact.     Conjunctiva/sclera: Conjunctivae normal.     Pupils: Pupils are equal, round, and reactive to light.  Cardiovascular:     Rate and Rhythm: Normal rate  and regular rhythm.     Pulses: Normal pulses.     Heart sounds: Normal heart sounds.  Pulmonary:     Effort: Pulmonary effort is normal.     Breath sounds: Normal breath sounds.  Musculoskeletal:        General: Normal range of motion.     Cervical back: Normal range of motion and neck supple.  Skin:    General: Skin is warm and dry.     Capillary Refill: Capillary refill takes less than 2 seconds.  Neurological:     General: No focal deficit present.     Mental Status: She is alert and oriented to person, place, and time.     (all labs ordered are listed, but only abnormal results are displayed) Labs Reviewed  COMPREHENSIVE METABOLIC PANEL WITH GFR - Abnormal; Notable for the following components:      Result Value   BUN 5 (*)    All other components within normal limits  CBC WITH DIFFERENTIAL/PLATELET - Abnormal; Notable for the following components:   WBC 3.7 (*)    Neutro Abs 1.6 (*)    All other components within normal limits  URINALYSIS, W/ REFLEX  TO CULTURE (INFECTION SUSPECTED)  HCG, SERUM, QUALITATIVE  I-STAT CG4 LACTIC ACID, ED    EKG: None  Radiology: No results found.   Procedures   Medications Ordered in the ED  amoxicillin -clavulanate (AUGMENTIN ) 875-125 MG per tablet 1 tablet (has no administration in time range)  ibuprofen  (ADVIL ) tablet 600 mg (has no administration in time range)                                    Medical Decision Making Amount and/or Complexity of Data Reviewed Labs: ordered.  Risk Prescription drug management.   This patient presents to the ED for concern of dental pain, this involves an extensive number of treatment options, and is a complaint that carries with it a high risk of complications and morbidity.  The differential diagnosis includes abscess, caries   Co morbidities that complicate the patient evaluation  none   Additional history obtained:  Additional history obtained from epic chart  review    Lab Tests:  I Ordered, and personally interpreted labs.  The pertinent results include:  cbc nl, cmp nl, ua nl, preg neg, lactic nl  Medicines ordered and prescription drug management:  I ordered medication including augmentin   for sx  Reevaluation of the patient after these medicines showed that the patient stayed the same I have reviewed the patients home medicines and have made adjustments as needed  Problem List / ED Course:  Dental abscess with likely retained root tip:  pt started on augmentin .  She is stable for d/c.  She is given the number of the dentist on call to f/u.  Return if worse.   Social Determinants of Health:  Lives at home   Dispostion:  After consideration of the diagnostic results and the patients response to treatment, I feel that the patent would benefit from discharge with outpatient f/u.       Final diagnoses:  Dental abscess    ED Discharge Orders          Ordered    amoxicillin -clavulanate (AUGMENTIN ) 875-125 MG tablet  Every 12 hours        11/06/24 1423    HYDROcodone -acetaminophen  (NORCO/VICODIN) 5-325 MG tablet  Every 4 hours PRN        11/06/24 1423    ibuprofen  (ADVIL ) 600 MG tablet  Every 6 hours PRN        11/06/24 1423               Dean Clarity, MD 11/06/24 1427  "

## 2024-11-06 NOTE — ED Triage Notes (Signed)
 Pt states she has had lower gum pain on the left side that started a few days ago. Pt has a knot on right side of breast. Pt has no pain in breast and f/u appt 11/12/2023.  Pt endorses fever

## 2024-11-07 ENCOUNTER — Ambulatory Visit: Payer: Self-pay | Admitting: Obstetrics and Gynecology

## 2024-11-07 DIAGNOSIS — B9689 Other specified bacterial agents as the cause of diseases classified elsewhere: Secondary | ICD-10-CM

## 2024-11-07 DIAGNOSIS — B3731 Acute candidiasis of vulva and vagina: Secondary | ICD-10-CM

## 2024-11-07 MED ORDER — METRONIDAZOLE 500 MG PO TABS: 500.0000 mg | ORAL_TABLET | Freq: Two times a day (BID) | ORAL | 0 refills | Status: AC

## 2024-11-07 MED ORDER — FLUCONAZOLE 150 MG PO TABS: 150.0000 mg | ORAL_TABLET | Freq: Once | ORAL | 0 refills | Status: AC

## 2024-11-11 ENCOUNTER — Ambulatory Visit
Admission: RE | Admit: 2024-11-11 | Discharge: 2024-11-11 | Disposition: A | Discharge status: Home / Self Care | Source: Ambulatory Visit | Attending: Obstetrics and Gynecology | Admitting: Obstetrics and Gynecology

## 2024-11-11 DIAGNOSIS — N63 Unspecified lump in unspecified breast: Secondary | ICD-10-CM

## 2024-11-11 LAB — CYTOLOGY - PAP
Diagnosis: NEGATIVE
Diagnosis: REACTIVE

## 2024-11-26 ENCOUNTER — Other Ambulatory Visit: Payer: Self-pay | Admitting: Obstetrics and Gynecology

## 2024-11-26 DIAGNOSIS — Z30011 Encounter for initial prescription of contraceptive pills: Secondary | ICD-10-CM

## 2024-11-26 MED ORDER — NORETHIN ACE-ETH ESTRAD-FE 1-20 MG-MCG(24) PO TABS: 1.0000 | ORAL_TABLET | Freq: Every day | ORAL | 3 refills | Status: AC

## 2024-11-26 NOTE — Progress Notes (Signed)
 Rx for birth control pills sent
